# Patient Record
Sex: Male | Born: 1935 | Race: White | Hispanic: No | Marital: Married | Smoking: Former smoker
Health system: Southern US, Community
[De-identification: ages and names within clinical notes are randomized; demographics above are authoritative.]

## PROBLEM LIST (undated history)

## (undated) DIAGNOSIS — E785 Hyperlipidemia, unspecified: Secondary | ICD-10-CM

## (undated) DIAGNOSIS — E079 Disorder of thyroid, unspecified: Secondary | ICD-10-CM

## (undated) DIAGNOSIS — I10 Essential (primary) hypertension: Secondary | ICD-10-CM

## (undated) HISTORY — PX: PARTIAL HIP ARTHROPLASTY: SHX733

## (undated) HISTORY — DX: Essential (primary) hypertension: I10

## (undated) HISTORY — DX: Hyperlipidemia, unspecified: E78.5

## (undated) HISTORY — DX: Disorder of thyroid, unspecified: E07.9

## (undated) HISTORY — PX: OTHER SURGICAL HISTORY: SHX169

---

## 2005-08-26 ENCOUNTER — Ambulatory Visit: Payer: Self-pay | Admitting: Gastroenterology

## 2010-09-23 ENCOUNTER — Ambulatory Visit: Payer: Self-pay | Admitting: Internal Medicine

## 2011-07-23 ENCOUNTER — Ambulatory Visit: Payer: Self-pay | Admitting: Pain Medicine

## 2011-08-03 ENCOUNTER — Ambulatory Visit: Payer: Self-pay | Admitting: Pain Medicine

## 2011-09-10 ENCOUNTER — Ambulatory Visit: Payer: Self-pay | Admitting: Pain Medicine

## 2011-09-30 ENCOUNTER — Ambulatory Visit: Payer: Self-pay | Admitting: Pain Medicine

## 2011-10-29 ENCOUNTER — Ambulatory Visit: Payer: Self-pay | Admitting: Pain Medicine

## 2011-11-11 ENCOUNTER — Ambulatory Visit: Payer: Self-pay | Admitting: Pain Medicine

## 2011-12-08 ENCOUNTER — Ambulatory Visit: Payer: Self-pay | Admitting: Pain Medicine

## 2011-12-21 ENCOUNTER — Ambulatory Visit: Payer: Self-pay | Admitting: Pain Medicine

## 2012-01-12 ENCOUNTER — Ambulatory Visit: Payer: Self-pay | Admitting: Pain Medicine

## 2012-01-25 ENCOUNTER — Ambulatory Visit: Payer: Self-pay | Admitting: Pain Medicine

## 2012-03-01 ENCOUNTER — Ambulatory Visit: Payer: Self-pay | Admitting: Pain Medicine

## 2012-03-14 ENCOUNTER — Ambulatory Visit: Payer: Self-pay | Admitting: Pain Medicine

## 2012-04-11 ENCOUNTER — Ambulatory Visit: Payer: Self-pay | Admitting: Pain Medicine

## 2012-05-10 ENCOUNTER — Ambulatory Visit: Payer: Self-pay | Admitting: Pain Medicine

## 2012-05-23 ENCOUNTER — Ambulatory Visit: Payer: Self-pay | Admitting: Pain Medicine

## 2012-06-06 ENCOUNTER — Ambulatory Visit: Payer: Self-pay | Admitting: Pain Medicine

## 2012-07-05 ENCOUNTER — Ambulatory Visit: Payer: Self-pay | Admitting: Pain Medicine

## 2012-08-04 ENCOUNTER — Ambulatory Visit: Payer: Self-pay | Admitting: Pain Medicine

## 2012-09-06 ENCOUNTER — Ambulatory Visit: Payer: Self-pay | Admitting: Pain Medicine

## 2012-09-12 ENCOUNTER — Ambulatory Visit: Payer: Self-pay | Admitting: Pain Medicine

## 2012-10-11 ENCOUNTER — Ambulatory Visit: Payer: Self-pay | Admitting: Pain Medicine

## 2013-03-06 ENCOUNTER — Ambulatory Visit: Payer: Self-pay | Admitting: Pain Medicine

## 2013-09-06 ENCOUNTER — Ambulatory Visit: Payer: Self-pay | Admitting: Pain Medicine

## 2013-10-10 ENCOUNTER — Ambulatory Visit: Payer: Self-pay | Admitting: Pain Medicine

## 2015-08-27 ENCOUNTER — Encounter: Payer: Self-pay | Admitting: Pain Medicine

## 2015-08-27 ENCOUNTER — Ambulatory Visit: Payer: Medicare Other | Attending: Pain Medicine | Admitting: Pain Medicine

## 2015-08-27 VITALS — BP 166/57 | HR 50 | Temp 98.0°F | Resp 16 | Ht 68.0 in | Wt 185.0 lb

## 2015-08-27 DIAGNOSIS — E039 Hypothyroidism, unspecified: Secondary | ICD-10-CM | POA: Insufficient documentation

## 2015-08-27 DIAGNOSIS — M79606 Pain in leg, unspecified: Secondary | ICD-10-CM | POA: Diagnosis present

## 2015-08-27 DIAGNOSIS — M51369 Other intervertebral disc degeneration, lumbar region without mention of lumbar back pain or lower extremity pain: Secondary | ICD-10-CM | POA: Insufficient documentation

## 2015-08-27 DIAGNOSIS — M1990 Unspecified osteoarthritis, unspecified site: Secondary | ICD-10-CM | POA: Diagnosis not present

## 2015-08-27 DIAGNOSIS — E785 Hyperlipidemia, unspecified: Secondary | ICD-10-CM | POA: Diagnosis not present

## 2015-08-27 DIAGNOSIS — Z8546 Personal history of malignant neoplasm of prostate: Secondary | ICD-10-CM | POA: Diagnosis not present

## 2015-08-27 DIAGNOSIS — M47816 Spondylosis without myelopathy or radiculopathy, lumbar region: Secondary | ICD-10-CM | POA: Insufficient documentation

## 2015-08-27 DIAGNOSIS — M533 Sacrococcygeal disorders, not elsewhere classified: Secondary | ICD-10-CM | POA: Insufficient documentation

## 2015-08-27 DIAGNOSIS — G629 Polyneuropathy, unspecified: Secondary | ICD-10-CM | POA: Insufficient documentation

## 2015-08-27 DIAGNOSIS — I10 Essential (primary) hypertension: Secondary | ICD-10-CM | POA: Diagnosis not present

## 2015-08-27 DIAGNOSIS — M5136 Other intervertebral disc degeneration, lumbar region: Secondary | ICD-10-CM | POA: Diagnosis not present

## 2015-08-27 DIAGNOSIS — M545 Low back pain: Secondary | ICD-10-CM | POA: Diagnosis present

## 2015-08-27 NOTE — Progress Notes (Signed)
Subjective:    Patient ID: Theodore Graves, male    DOB: 08/29/1926, 82 y.o.   MRN: [id number]  HPI  The patient is an 82 year old gentleman who comes to pain management at the request of Dr. Einar CrowMarshall Anderson for further evaluation and treatment of pain involving the lower back and lower extremity region. The patient states that his lower back lower extremity pain has interfered with activities of daily living to significant degree. The patient denies any trauma change in events of daily living the call significant change in symptoms pathology. The patient is with known degenerative disc disease of the lumbar spine. The patient appeared to be with exacerbation of his pain due to degenerative disc disease of the lumbar spine. We discussed patient's condition and patient described her pain as aching and annoying pain that awakens patient from sleep. Patient states the pain is without known alleviating factors. We discussed patient's condition and will consider patient for lumbar facet, medial branch nerve, blocks to be performed at time of return appointment. We will consider patient for additional studies as well as consider patient for medication adjustments pending response to treatment and follow-up evaluation. All agreed to suggested treatment plan.             Review of Systems    Cardiovascular: High blood pressure  Pulmonary: Unremarkable  Neurological: Neuropathy of lower extremities  Psychological: Unremarkable  Gastrointestinal: Hyperlipidemia  Genitourinary: Carcinoma of prostate  Hematologic: Unremarkable  Endocrine: Hypothyroidism  Rheumatological: Osteoarthritis  Musculoskeletal: Unremarkable  Other significant: Unremarkable      Objective:   Physical Exam   There was tenderness of the splenius capitis and occipitalis region a mild degree with mild tenderness of the acromioclavicular and glenohumeral joint regions. Palpation of the cervical  facet cervical paraspinal musculature region was with mild tenderness to palpation. The patient appeared to be with unremarkable Spurling's maneuver. The patient appeared to be with bilaterally equal grip strength and Tinel and Phalen's maneuver were without increase of pain of significant degree. There was mild tenderness of the thoracic facet thoracic paraspinal musculature region. No crepitus of the thoracic region was noted. Palpation over the region of the PSIS and PII S region reproduced moderate discomfort. There was moderate increase of pain with pressure applied to the ileum with patient in lateral becomes position. Palpation of the gluteal and piriformis musculature regions reproduce moderate discomfort. There was mild tenderness of the greater trochanteric region iliotibial band region. Straight leg raise was tolerates approximately 30 without increase of pain with dorsiflexion noted. EHL strength appeared to be slightly decreased. DTRs were difficult to elicit appeared to be trace at the knees. There was negative clonus negative Homans and no sensory deficit or dermatomal dystrophy detected. The knees were attends to palpation in crepitus of the knees with negative anterior and posterior drawer signs without ballottement of the patella. There was negative clonus negative Homans. Abdomen was nontender and no costovertebral tenderness was noted.     Assessment & Plan:   Degenerative disc disease lumbar spine  Lumbar facet syndrome  Sacroiliac joint dysfunction  Hypothyroidism  Neuropathy of feet  History of cancer of prostate      PLAN  Continue present medications. Take vitamin B complex as discussed We may consider Neurontin, Cymbalta or other medications for treatment of pain of the feet  Lumbar facet, medial branch nerve blocks to be performed at time of return appointment  F/U PCP Dr. Dareen PianoAnderson for evaluation of  BP and general medical  condition  F/U surgical evaluation.  May consider pending follow-up evaluations  F/U neurological evaluation. May consider PNCV/EMG studies and other studies evaluate lower extremity paresthesias with concern regarding neuropathy due to treatment for carcinoma of prostate pending follow-up evaluations  May consider radiofrequency rhizolysis or intraspinal procedures pending response to present treatment and F/U evaluation   Patient to call [facility] should patient have concerns prior to scheduled return appointment

## 2015-08-27 NOTE — Patient Instructions (Addendum)
PLAN  Continue present medications. Take vitamin B complex as discussed We may consider Neurontin, Cymbalta or other medications for treatment of pain of the feet  Lumbar facet, medial branch nerve blocks to be performed at time of return appointment  F/U PCP Dr. Dareen PianoAnderson for evaluation of  BP and general medical  condition  F/U surgical evaluation. May consider pending follow-up evaluations  F/U neurological evaluation. May consider pending follow-up evaluations  May consider radiofrequency rhizolysis or intraspinal procedures pending response to present treatment and F/U evaluation   Patient to call [facility] Management Center should patient have concerns prior to scheduled return appointment. Facet Joint Block The facet joints connect the bones of the spine (vertebrae). They make it possible for you to bend, twist, and make other movements with your spine. They also prevent you from overbending, overtwisting, and making other excessive movements.  A facet joint block is a procedure where a numbing medicine (anesthetic) is injected into a facet joint. Often, a type of anti-inflammatory medicine called a steroid is also injected. A facet joint block may be done for two reasons:   Diagnosis. A facet joint block may be done as a test to see whether neck or back pain is caused by a worn-down or infected facet joint. If the pain gets better after a facet joint block, it means the pain is probably coming from the facet joint. If the pain does not get better, it means the pain is probably not coming from the facet joint.   Therapy. A facet joint block may be done to relieve neck or back pain caused by a facet joint. A facet joint block is only done as a therapy if the pain does not improve with medicine, exercise programs, physical therapy, and other forms of pain management. LET [facility]YOUR HEALTH CARE PROVIDER KNOW ABOUT:   Any allergies you have.   All medicines you are taking, including vitamins,  herbs, eyedrops, and over-the-counter medicines and creams.   Previous problems you or members of your family have had with the use of anesthetics.   Any blood disorders you have had.   Other health problems you have. RISKS AND COMPLICATIONS Generally, having a facet joint block is safe. However, as with any procedure, complications can occur. Possible complications associated with having a facet joint block include:   Bleeding.   Injury to a nerve near the injection site.   Pain at the injection site.   Weakness or numbness in areas controlled by nerves near the injection site.   Infection.   Temporary fluid retention.   Allergic reaction to anesthetics or medicines used during the procedure. BEFORE THE PROCEDURE   Follow your health care provider's instructions if you are taking dietary supplements or medicines. You may need to stop taking them or reduce your dosage.   Do not take any new dietary supplements or medicines without asking your health care provider first.   Follow your health care provider's instructions about eating and drinking before the procedure. You may need to stop eating and drinking several hours before the procedure.   Arrange to have an adult drive you home after the procedure. PROCEDURE  You may need to remove your clothing and dress in an open-back gown so that your health care provider can access your spine.   The procedure will be done while you are lying on an X-ray table. Most of the time you will be asked to lie on your stomach, but you may be asked to  lie in a different position if an injection will be made in your neck.   Special machines will be used to monitor your oxygen levels, heart rate, and blood pressure.   If an injection will be made in your neck, an intravenous (IV) tube will be inserted into one of your veins. Fluids and medicine will flow directly into your body through the IV tube.   The area over the facet  joint where the injection will be made will be cleaned with an antiseptic soap. The surrounding skin will be covered with sterile drapes.   An anesthetic will be applied to your skin to make the injection area numb. You may feel a temporary stinging or burning sensation.   A video X-ray machine will be used to locate the joint. A contrast dye may be injected into the facet joint area to help with locating the joint.   When the joint is located, an anesthetic medicine will be injected into the joint through the needle.   Your health care provider will ask you whether you feel pain relief. If you do feel relief, a steroid may be injected to provide pain relief for a longer period of time. If you do not feel relief or feel only partial relief, additional injections of an anesthetic may be made in other facet joints.   The needle will be removed, the skin will be cleansed, and bandages will be applied.  AFTER THE PROCEDURE   You will be observed for 15-30 minutes before being allowed to go home. Do not drive. Have an adult drive you or take a taxi or public transportation instead.   If you feel pain relief, the pain will return in several hours or days when the anesthetic wears off.   You may feel pain relief 2-14 days after the procedure. The amount of time this relief lasts varies from person to person.   It is normal to feel some tenderness over the injected area(s) for 2 days following the procedure.   If you have diabetes, you may have a temporary increase in blood sugar.   This information is not intended to replace advice given to you by your health care provider. Make sure you discuss any questions you have with your health care provider.   Document Released: 07/08/2006 Document Revised: 03/09/2014 Document Reviewed: 12/07/2011 Elsevier Interactive Patient Education 2016 [employer] GENERAL RISKS AND COMPLICATIONS  What are the risk, side effects and possible  complications? Generally speaking, most procedures are safe.  However, with any procedure there are risks, side effects, and the possibility of complications.  The risks and complications are dependent upon the sites that are lesioned, or the type of nerve block to be performed.  The closer the procedure is to the spine, the more serious the risks are.  Great care is taken when placing the radio frequency needles, block needles or lesioning probes, but sometimes complications can occur.  Infection: Any time there is an injection through the skin, there is a risk of infection.  This is why sterile conditions are used for these blocks.  There are four possible types of infection.  Localized skin infection.  Central Nervous System Infection-This can be in the form of Meningitis, which can be deadly.  Epidural Infections-This can be in the form of an epidural abscess, which can cause pressure inside of the spine, causing compression of the spinal cord with subsequent paralysis. This would require an emergency surgery to decompress, and there are no  guarantees that the patient would recover from the paralysis.  Discitis-This is an infection of the intervertebral discs.  It occurs in about 1% of discography procedures.  It is difficult to treat and it may lead to surgery.        2. Pain: the needles have to go through skin and soft tissues, will cause soreness.       3. Damage to internal structures:  The nerves to be lesioned may be near blood vessels or    other nerves which can be potentially damaged.       4. Bleeding: Bleeding is more common if the patient is taking blood thinners such as  aspirin, Coumadin, Ticiid, Plavix, etc., or if he/she have some genetic predisposition  such as hemophilia. Bleeding into the spinal canal can cause compression of the spinal  cord with subsequent paralysis.  This would require an emergency surgery to  decompress and there are no guarantees that the patient would  recover from the  paralysis.       5. Pneumothorax:  Puncturing of a lung is a possibility, every time a needle is introduced in  the area of the chest or upper back.  Pneumothorax refers to free air around the  collapsed lung(s), inside of the thoracic cavity (chest cavity).  Another two possible  complications related to a similar event would include: Hemothorax and Chylothorax.   These are variations of the Pneumothorax, where instead of air around the collapsed  lung(s), you may have blood or chyle, respectively.       6. Spinal headaches: They may occur with any procedures in the area of the spine.       7. Persistent CSF (Cerebro-Spinal Fluid) leakage: This is a rare problem, but may occur  with prolonged intrathecal or epidural catheters either due to the formation of a fistulous  track or a dural tear.       8. Nerve damage: By working so close to the spinal cord, there is always a possibility of  nerve damage, which could be as serious as a permanent spinal cord injury with  paralysis.       9. Death:  Although rare, severe deadly allergic reactions known as "Anaphylactic  reaction" can occur to any of the medications used.      10. Worsening of the symptoms:  We can always make thing worse.  What are the chances of something like this happening? Chances of any of this occuring are extremely low.  By statistics, you have more of a chance of getting killed in a motor vehicle accident: while driving to the hospital than any of the above occurring .  Nevertheless, you should be aware that they are possibilities.  In general, it is similar to taking a shower.  Everybody knows that you can slip, hit your head and get killed.  Does that mean that you should not shower again?  Nevertheless always keep in mind that statistics do not mean anything if you happen to be on the wrong side of them.  Even if a procedure has a 1 (one) in a 1,000,000 (million) chance of going wrong, it you happen to be that  one..Also, keep in mind that by statistics, you have more of a chance of having something go wrong when taking medications.  Who should not have this procedure? If you are on a blood thinning medication (e.g. Coumadin, Plavix, see list of "Blood Thinners"), or if you have an active infection going  on, you should not have the procedure.  If you are taking any blood thinners, please inform your physician.  How should I prepare for this procedure?  Do not eat or drink anything at least six hours prior to the procedure.  Bring a driver with you .  It cannot be a taxi.  Come accompanied by an adult that can drive you back, and that is strong enough to help you if your legs get weak or numb from the local anesthetic.  Take all of your medicines the morning of the procedure with just enough water to swallow them.  If you have diabetes, make sure that you are scheduled to have your procedure done first thing in the morning, whenever possible.  If you have diabetes, take only half of your insulin dose and notify our nurse that you have done so as soon as you arrive at the clinic.  If you are diabetic, but only take blood sugar pills (oral hypoglycemic), then do not take them on the morning of your procedure.  You may take them after you have had the procedure.  Do not take aspirin or any aspirin-containing medications, at least eleven (11) days prior to the procedure.  They may prolong bleeding.  Wear loose fitting clothing that may be easy to take off and that you would not mind if it got stained with Betadine or blood.  Do not wear any jewelry or perfume  Remove any nail coloring.  It will interfere with some of our monitoring equipment.  NOTE: Remember that this is not meant to be interpreted as a complete list of all possible complications.  Unforeseen problems may occur.  BLOOD THINNERS The following drugs contain aspirin or other products, which can cause increased bleeding during  surgery and should not be taken for 2 weeks prior to and 1 week after surgery.  If you should need take something for relief of minor pain, you may take acetaminophen which is found in Tylenol,m Datril, Anacin-3 and Panadol. It is not blood thinner. The products listed below are.  Do not take any of the products listed below in addition to any listed on your instruction sheet.  A.P.C or A.P.C with Codeine Codeine Phosphate Capsules #3 Ibuprofen Ridaura  ABC compound Congesprin Imuran rimadil  Advil Cope Indocin Robaxisal  Alka-Seltzer Effervescent Pain Reliever and Antacid Coricidin or Coricidin-D  Indomethacin Rufen  Alka-Seltzer plus Cold Medicine Cosprin Ketoprofen S-A-C Tablets  Anacin Analgesic Tablets or Capsules Coumadin Korlgesic Salflex  Anacin Extra Strength Analgesic tablets or capsules CP-2 Tablets Lanoril Salicylate  Anaprox Cuprimine Capsules Levenox Salocol  Anexsia-D Dalteparin Magan Salsalate  Anodynos Darvon compound Magnesium Salicylate Sine-off  Ansaid Dasin Capsules Magsal Sodium Salicylate  Anturane Depen Capsules Marnal Soma  APF Arthritis pain formula Dewitt's Pills Measurin Stanback  Argesic Dia-Gesic Meclofenamic Sulfinpyrazone  Arthritis Bayer Timed Release Aspirin Diclofenac Meclomen Sulindac  Arthritis pain formula Anacin Dicumarol Medipren Supac  Analgesic (Safety coated) Arthralgen Diffunasal Mefanamic Suprofen  Arthritis Strength Bufferin Dihydrocodeine Mepro Compound Suprol  Arthropan liquid Dopirydamole Methcarbomol with Aspirin Synalgos  ASA tablets/Enseals Disalcid Micrainin Tagament  Ascriptin Doan's Midol Talwin  Ascriptin A/D Dolene Mobidin Tanderil  Ascriptin Extra Strength Dolobid Moblgesic Ticlid  Ascriptin with Codeine Doloprin or Doloprin with Codeine Momentum Tolectin  Asperbuf Duoprin Mono-gesic Trendar  Aspergum Duradyne Motrin or Motrin IB Triminicin  Aspirin plain, buffered or enteric coated Durasal Myochrisine Trigesic  Aspirin  Suppositories Easprin Nalfon Trillsate  Aspirin with Codeine Ecotrin Regular or Extra Strength  Naprosyn Uracel  Atromid-S Efficin Naproxen Ursinus  Auranofin Capsules Elmiron Neocylate Vanquish  Axotal Emagrin Norgesic Verin  Azathioprine Empirin or Empirin with Codeine Normiflo Vitamin E  Azolid Emprazil Nuprin Voltaren  Bayer Aspirin plain, buffered or children's or timed BC Tablets or powders Encaprin Orgaran Warfarin Sodium  Buff-a-Comp Enoxaparin Orudis Zorpin  Buff-a-Comp with Codeine Equegesic Os-Cal-Gesic   Buffaprin Excedrin plain, buffered or Extra Strength Oxalid   Bufferin Arthritis Strength Feldene Oxphenbutazone   Bufferin plain or Extra Strength Feldene Capsules Oxycodone with Aspirin   Bufferin with Codeine Fenoprofen Fenoprofen Pabalate or Pabalate-SF   Buffets II Flogesic Panagesic   Buffinol plain or Extra Strength Florinal or Florinal with Codeine Panwarfarin   Buf-Tabs Flurbiprofen Penicillamine   Butalbital Compound Four-way cold tablets Penicillin   Butazolidin Fragmin Pepto-Bismol   Carbenicillin Geminisyn Percodan   Carna Arthritis Reliever Geopen Persantine   Carprofen Gold's salt Persistin   Chloramphenicol Goody's Phenylbutazone   Chloromycetin Haltrain Piroxlcam   Clmetidine heparin Plaquenil   Cllnoril Hyco-pap Ponstel   Clofibrate Hydroxy chloroquine Propoxyphen         Before stopping any of these medications, be sure to consult the physician who ordered them.  Some, such as Coumadin (Warfarin) are ordered to prevent or treat serious conditions such as "deep thrombosis", "pumonary embolisms", and other heart problems.  The amount of time that you may need off of the medication may also vary with the medication and the reason for which you were taking it.  If you are taking any of these medications, please make sure you notify your pain physician before you undergo any procedures.

## 2015-08-27 NOTE — Progress Notes (Signed)
Safety precautions to be maintained throughout the outpatient stay will include: orient to surroundings, keep bed in low position, maintain call bell within reach at all times, provide assistance with transfer out of bed and ambulation.  

## 2015-09-05 LAB — TOXASSURE SELECT 13 (MW), URINE: PDF: 0

## 2015-09-05 NOTE — Progress Notes (Signed)
Quick Note:  Reviewed. ______ 

## 2015-09-18 ENCOUNTER — Ambulatory Visit: Payer: Medicare Other | Attending: Pain Medicine | Admitting: Pain Medicine

## 2015-09-18 ENCOUNTER — Encounter: Payer: Self-pay | Admitting: Pain Medicine

## 2015-09-18 VITALS — BP 142/78 | HR 52 | Temp 97.7°F | Resp 16 | Ht 67.0 in | Wt 180.0 lb

## 2015-09-18 DIAGNOSIS — M5136 Other intervertebral disc degeneration, lumbar region: Secondary | ICD-10-CM | POA: Insufficient documentation

## 2015-09-18 DIAGNOSIS — M545 Low back pain: Secondary | ICD-10-CM | POA: Diagnosis present

## 2015-09-18 DIAGNOSIS — M47816 Spondylosis without myelopathy or radiculopathy, lumbar region: Secondary | ICD-10-CM

## 2015-09-18 DIAGNOSIS — M533 Sacrococcygeal disorders, not elsewhere classified: Secondary | ICD-10-CM

## 2015-09-18 DIAGNOSIS — M79606 Pain in leg, unspecified: Secondary | ICD-10-CM | POA: Diagnosis present

## 2015-09-18 MED ORDER — CEFAZOLIN IN D5W 1 GM/50ML IV SOLN
1.0000 g | Freq: Once | INTRAVENOUS | Status: DC
Start: 2015-09-18 — End: 2020-05-24

## 2015-09-18 MED ORDER — ORPHENADRINE CITRATE 30 MG/ML IJ SOLN
60.0000 mg | Freq: Once | INTRAMUSCULAR | Status: DC
  Filled 2015-09-18: qty 2

## 2015-09-18 MED ORDER — MIDAZOLAM HCL 5 MG/5ML IJ SOLN
5.0000 mg | Freq: Once | INTRAMUSCULAR | Status: DC
  Filled 2015-09-18: qty 5

## 2015-09-18 MED ORDER — CEFAZOLIN SODIUM 1 G IJ SOLR
INTRAMUSCULAR | Status: AC
  Administered 2015-09-18: 1 g
  Filled 2015-09-18: qty 10

## 2015-09-18 MED ORDER — BUPIVACAINE HCL (PF) 0.25 % IJ SOLN
30.0000 mL | Freq: Once | INTRAMUSCULAR | Status: AC
  Administered 2015-09-18: 30 mL
  Filled 2015-09-18: qty 30

## 2015-09-18 MED ORDER — FENTANYL CITRATE (PF) 100 MCG/2ML IJ SOLN
100.0000 ug | Freq: Once | INTRAMUSCULAR | Status: DC
  Filled 2015-09-18: qty 2

## 2015-09-18 MED ORDER — CEFUROXIME AXETIL 250 MG PO TABS: 250.0000 mg | ORAL_TABLET | Freq: Two times a day (BID) | ORAL | Status: DC

## 2015-09-18 MED ORDER — LACTATED RINGERS IV SOLN: 1000.0000 mL | INTRAVENOUS | Status: DC

## 2015-09-18 MED ORDER — TRIAMCINOLONE ACETONIDE 40 MG/ML IJ SUSP
40.0000 mg | Freq: Once | INTRAMUSCULAR | Status: AC
Start: 2015-09-18 — End: 2015-09-18
  Administered 2015-09-18: 40 mg
  Filled 2015-09-18: qty 1

## 2015-09-18 NOTE — Progress Notes (Signed)
Safety precautions to be maintained throughout the outpatient stay will include: orient to surroundings, keep bed in low position, maintain call bell within reach at all times, provide assistance with transfer out of bed and ambulation.  

## 2015-09-18 NOTE — Progress Notes (Signed)
Subjective:    Patient ID: Theodore Graves, male    DOB: [birth year], 82 y.o.   MRN: [id number]  HPI  PROCEDURE PERFORMED: Lumbar facet (medial branch block)   NOTE: The patient is a 82 y.o. male who [facility] for further evaluation and treatment of pain involving the lumbar and lower extremity region. Prior studies revealed the patient to be with evidence of multilevel degenerative disc disease of the lumbar spine. There is concern regarding significant component of patient's pain being due to lumbar facet syndrome. The risks, benefits, and expectations of the procedure have been discussed and explained to the patient who was understanding and in agreement with suggested treatment plan. We will proceed with interventional treatment as discussed and as explained to the patient who was understanding and wished to proceed with procedure as planned.   DESCRIPTION OF PROCEDURE: Lumbar facet (medial branch block) with  EKG, blood pressure, pulse, capnography, and pulse oximetry monitoring. The procedure was performed with the patient in the prone position. Betadine prep of proposed entry site performed.   NEEDLE PLACEMENT AT: Left L 2 lumbar facet (medial branch block). Under fluoroscopic guidance with oblique orientation of 15 degrees, a 22-gauge needle was inserted at the L 2 vertebral body level with needle placed at the targeted area of Burton's Eye or Eye of the Scotty Dog with documentation of needle placement in the superior and lateral border of targeted area of Burton's Eye or Eye of the Scotty Dog with oblique orientation of 15 degrees. Following documentation of needle placement at the L 2 vertebral body level, needle placement was then accomplished at the L 3 vertebral body level.   NEEDLE PLACEMENT AT L3, L4, and L5 VERTEBRAL BODY LEVELS ON THE LEFT SIDE The procedure was performed at the L3, L4, and L5 vertebral body levels exactly as was performed at the L 2 vertebral  body level utilizing the same technique and under fluoroscopic guidance.  NEEDLE PLACEMENT AT THE SACRAL ALA with AP view of the lumbosacral spine. With the patient in the prone position, Betadine prep of proposed entry site accomplished, a 22 gauge needle was inserted in the region of the sacral ala (groove formed by the superior articulating process of S1 and the sacral wing). Following documentation of needle placement at the sacral ala,   Needle placement was then verified at all levels on lateral view. Following documentation of needle placement at all levels on lateral view and following negative aspiration for heme and CSF, each level was injected with 1 mL of 0.25% bupivacaine with Kenalog.     LUMBAR FACET, MEDIAL BRANCH NERVE, BLOCKS PERFORMED ON THE RIGHT SIDE   The procedure was performed on the right side exactly as was performed on the left side at the same levels and utilizing the same technique under fluoroscopic guidance.     The patient tolerated the procedure well. A total of 40 mg of Kenalog was utilized for the procedure.   PLAN:  1. Medications: The patient will continue presently prescribed medications. 2. May consider modification of treatment regimen at time of return appointment pending response to treatment rendered on today's visit. 3. The patient is to follow-up with primary care physician  Dr. Dareen Piano for further evaluation of blood pressure and general medical condition status post steroid injection performed on today's visit. 4. Surgical follow-up evaluation.Marland Kitchen Has been addressed. Patient prefers to avoid surgical evaluation at this time 5. Neurological follow-up evaluation.. May consider [facility] EMG studies  and other studies 6. The patient may be candidate for radiofrequency procedures, implantation type procedures, and other treatment pending response to treatment and follow-up evaluation. 7. The patient has been advised to call the [facility]  prior to scheduled return appointment should there be significant change in condition or should patient have other concerns regarding condition prior to scheduled return appointment.  The patient is understanding and in agreement with suggested treatment plan.   Review of Systems     Objective:   Physical Exam        Assessment & Plan:

## 2015-09-18 NOTE — Patient Instructions (Addendum)
PLAN  Continue present medications. Take vitamin B complex as discussed We may consider Neurontin, Cymbalta or other medications for treatment of pain of the feet Please obtain Ceftin antibiotic today and begin taking Ceftin antibiotic today as prescribed  F/U PCP Dr. Dareen PianoAnderson for evaluation of  BP and general medical  condition  F/U surgical evaluation. May consider pending follow-up evaluations  F/U neurological evaluation. May consider pending follow-up evaluations  May consider radiofrequency rhizolysis or intraspinal procedures pending response to present treatment and F/U evaluation   Patient to call [facility] should patient have concerns prior to scheduled return appointment.Pain Management Discharge Instructions  General Discharge Instructions :  If you need to reach your doctor call: Monday-Friday 8:00 am - 4:00 pm at [phone] or toll free [phone].  After clinic hours [phone] to have operator reach doctor.  Bring all of your medication bottles to all your appointments in the pain clinic.  To cancel or reschedule your appointment with Pain Management please remember to call 24 hours in advance to avoid a fee.  Refer to the educational materials which you have been given on: General Risks, I had my Procedure. Discharge Instructions, Post Sedation.  Post Procedure Instructions:  The drugs you were given will stay in your system until tomorrow, so for the next 24 hours you should not drive, make any legal decisions or drink any alcoholic beverages.  You may eat anything you prefer, but it is better to start with liquids then soups and crackers, and gradually work up to solid foods.  Please notify your doctor immediately if you have any unusual bleeding, trouble breathing or pain that is not related to your normal pain.  Depending on the type of procedure that was done, some parts of your body may feel week and/or numb.  This usually clears up by  tonight or the next day.  Walk with the use of an assistive device or accompanied by an adult for the 24 hours.  You may use ice on the affected area for the first 24 hours.  Put ice in a Ziploc bag and cover with a towel and place against area 15 minutes on 15 minutes off.  You may switch to heat after 24 hours.

## 2015-09-19 ENCOUNTER — Telehealth: Payer: Self-pay | Admitting: *Deleted

## 2015-09-19 NOTE — Telephone Encounter (Signed)
Voicemail left with patient to call our office if there are questions or concerns re; procedure on yesterday.  

## 2015-10-15 ENCOUNTER — Encounter: Payer: Self-pay | Admitting: Pain Medicine

## 2015-10-15 ENCOUNTER — Ambulatory Visit: Payer: Medicare Other | Attending: Pain Medicine | Admitting: Pain Medicine

## 2015-10-15 VITALS — BP 124/60 | HR 59 | Temp 97.9°F | Resp 16 | Ht 67.0 in | Wt 180.0 lb

## 2015-10-15 DIAGNOSIS — M5136 Other intervertebral disc degeneration, lumbar region: Secondary | ICD-10-CM | POA: Insufficient documentation

## 2015-10-15 DIAGNOSIS — Z8546 Personal history of malignant neoplasm of prostate: Secondary | ICD-10-CM | POA: Insufficient documentation

## 2015-10-15 DIAGNOSIS — M79606 Pain in leg, unspecified: Secondary | ICD-10-CM | POA: Diagnosis present

## 2015-10-15 DIAGNOSIS — M545 Low back pain: Secondary | ICD-10-CM | POA: Diagnosis present

## 2015-10-15 DIAGNOSIS — E039 Hypothyroidism, unspecified: Secondary | ICD-10-CM | POA: Insufficient documentation

## 2015-10-15 DIAGNOSIS — G629 Polyneuropathy, unspecified: Secondary | ICD-10-CM | POA: Diagnosis not present

## 2015-10-15 DIAGNOSIS — M533 Sacrococcygeal disorders, not elsewhere classified: Secondary | ICD-10-CM | POA: Diagnosis not present

## 2015-10-15 DIAGNOSIS — M47816 Spondylosis without myelopathy or radiculopathy, lumbar region: Secondary | ICD-10-CM

## 2015-10-15 NOTE — Progress Notes (Signed)
Safety precautions to be maintained throughout the outpatient stay will include: orient to surroundings, keep bed in low position, maintain call bell within reach at all times, provide assistance with transfer out of bed and ambulation.  

## 2015-10-15 NOTE — Progress Notes (Signed)
     The patient is an 82 year old gentleman who returns to pain management for further evaluation and treatment of pain involving the lumbar lower extremity region. The patient is status post lumbar facet, medial branch nerve, blocks performed at time of previous visit to pain management. The patient returned today and stated that he is pain-free. The patient is able to perform all activities of daily living as well as obtaining restful sleep without dispense any pain. The patient is without need for medications for treatment of any pain is well. We informed patient that we will remain available to consider additional interventional treatment of modification of treatment regimen should they be return of any significant pain. The patient was understanding and agreed to call if he had any return of significant pain. The patient was extremely pleased with the success of the procedure performed treatment of his pain. We remain available to consider additional medication of treatment as needed. All agreed to suggested treatment plan.    Physical examination There was there was mild tinnitus to palpation of paraspinal muscular treat the cervical region cervical facet region with mild tenderness of the splenius capitis and occipitalis region. Palpation of the acromioclavicular and glenohumeral joint region was attends to palpation of minimal degree and patient appeared to be with minimal difficulty performing drop test. The patient was with bilaterally equal grip strength without increased pain with Tinel and Phalen's maneuver. Palpation over the lumbar paraspinal musculature and lumbar facet region was without increased pain with lateral bending rotation extension and palpation over the lumbar facet region. There was no significant tends to palpation of the greater trochanteric region iliotibial band region. Palpation over the PSIS and PII S region was without increased pain of significant degree. Straight leg  raising tolerate 30 without increased pain with dorsiflexion noted. DTRs were difficult to elicit appeared to be trace at the knees. No sensory deficit or dermatomal dystrophy detected. Negative clonus negative Homans.. The abdomen was nontender and no costovertebral tenderness was noted.     Assessment  Degenerative disc disease lumbar spine  Lumbar facet syndrome  Sacroiliac joint dysfunction  Hypothyroidism  Neuropathy of feet  History of cancer of prostate     PLAN  Continue present medications. Take vitamin B complex as discussed We may consider Neurontin, Cymbalta or other medications for treatment of pain of the feet  F/U PCP Dr. Dareen PianoAnderson for evaluation of  BP and general medical  condition  F/U surgical evaluation. May consider pending follow-up evaluations  F/U neurological evaluation. May consider pending follow-up evaluations  May consider radiofrequency rhizolysis or intraspinal procedures pending response to present treatment and F/U evaluation   Patient to call [facility] should patient have concerns prior to scheduled return appointment.

## 2015-10-15 NOTE — Patient Instructions (Signed)
PLAN  Continue present medications. Take vitamin B complex as discussed We may consider Neurontin, Cymbalta or other medications for treatment of pain of the feet  F/U PCP Dr. Dareen PianoAnderson for evaluation of  BP and general medical  condition  F/U surgical evaluation. May consider pending follow-up evaluations  F/U neurological evaluation. May consider pending follow-up evaluations  May consider radiofrequency rhizolysis or intraspinal procedures pending response to present treatment and F/U evaluation   Patient to call [facility] should patient have concerns prior to scheduled return appointment.

## 2017-10-21 ENCOUNTER — Other Ambulatory Visit: Payer: Self-pay | Admitting: Physical Medicine and Rehabilitation

## 2017-10-21 DIAGNOSIS — M5416 Radiculopathy, lumbar region: Secondary | ICD-10-CM

## 2017-11-04 ENCOUNTER — Ambulatory Visit
Admission: RE | Admit: 2017-11-04 | Discharge: 2017-11-04 | Disposition: A | Discharge status: Home / Self Care | Payer: Medicare Other | Source: Ambulatory Visit | Attending: Physical Medicine and Rehabilitation | Admitting: Physical Medicine and Rehabilitation

## 2017-11-04 DIAGNOSIS — M5416 Radiculopathy, lumbar region: Secondary | ICD-10-CM | POA: Diagnosis present

## 2017-11-04 DIAGNOSIS — M4697 Unspecified inflammatory spondylopathy, lumbosacral region: Secondary | ICD-10-CM | POA: Diagnosis not present

## 2017-11-04 DIAGNOSIS — M5116 Intervertebral disc disorders with radiculopathy, lumbar region: Secondary | ICD-10-CM | POA: Diagnosis not present

## 2017-11-04 DIAGNOSIS — M7138 Other bursal cyst, other site: Secondary | ICD-10-CM | POA: Insufficient documentation

## 2019-08-30 IMAGING — MR MR LUMBAR SPINE W/O CM
5 series · 31 of 48 positions shown · non-contrast
Comparison: MRI dated 09/23/2010

CLINICAL DATA: Low back pain and right hip and leg pain. Lumbar
radiculitis.

EXAM:
MRI LUMBAR SPINE WITHOUT CONTRAST
TECHNIQUE: Multiplanar, multisequence MR imaging of the lumbar spine was
performed. No intravenous contrast was administered.

[Series 9: T2 · sagittal · 4.0mm · 0.81mm/px · 6 of 17 slices shown (1 of 2)]
[im 1/17]
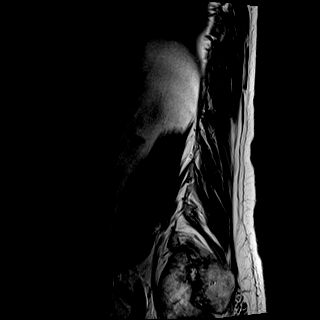
[im 4/17]
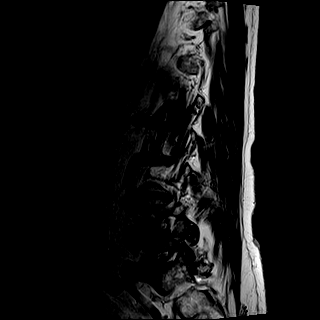
[im 7/17]
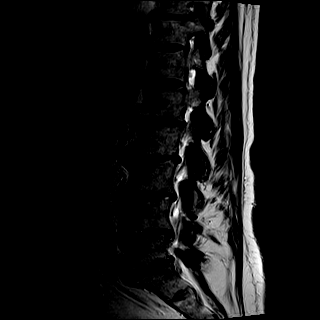
[im 10/17]
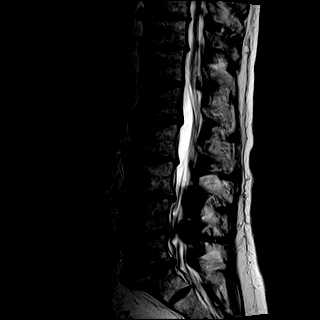
[im 13/17]
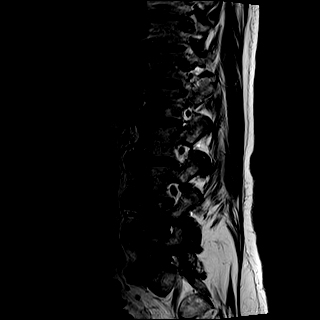
[im 17/17]
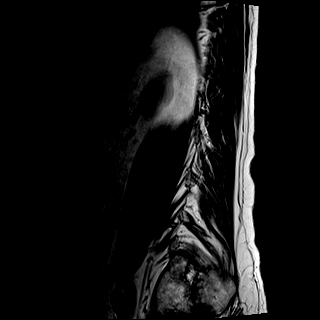

[Series 10: T1 · sagittal · 4.0mm · 0.81mm/px · 6 of 17 slices shown (1 of 2)]
[im 1/17]
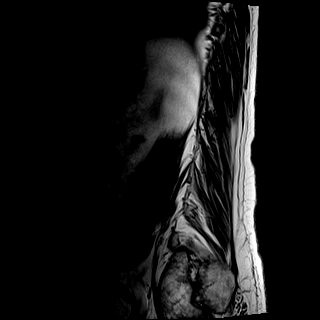
[im 4/17]
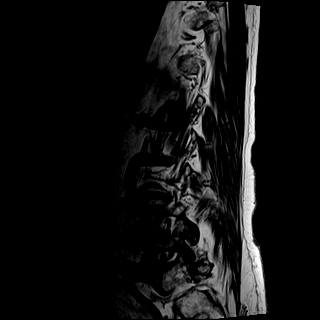
[im 7/17]
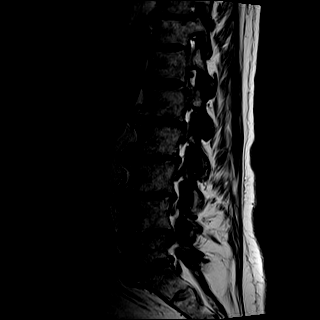
[im 10/17]
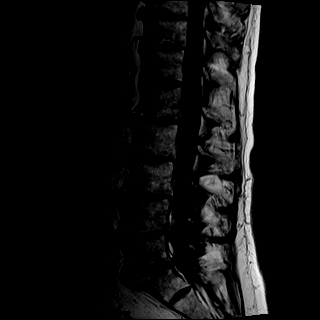
[im 13/17]
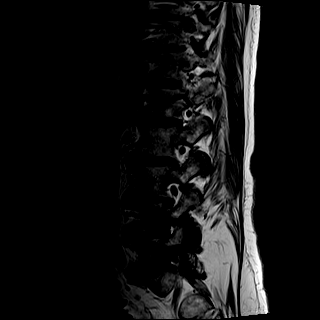
[im 17/17]
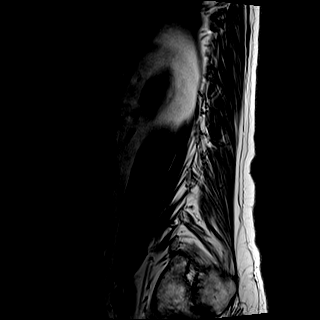

[Series 11: STIR · sagittal · 4.0mm · 0.41mm/px · 1 of 17 slices shown]
[im 1/17]
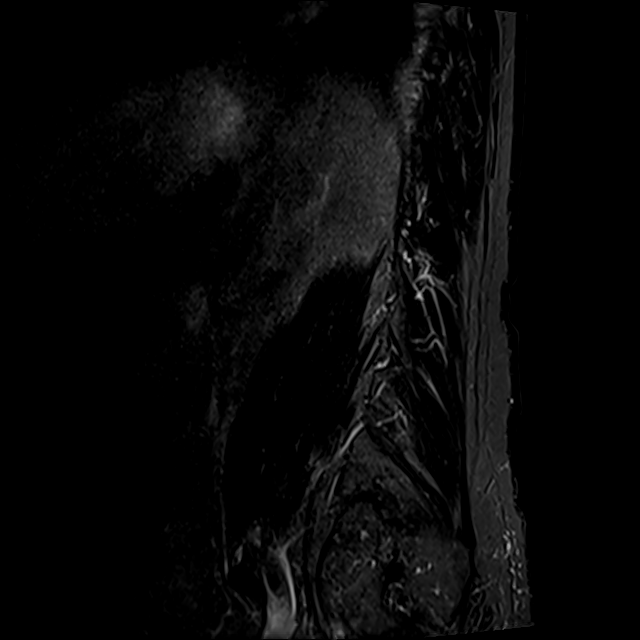

[Series 12: T2 · axial · 4.0mm · 0.78mm/px · z∈[-195,+53]mm · 9 of 44 slices shown (2 of 2)]
[im 1/44]
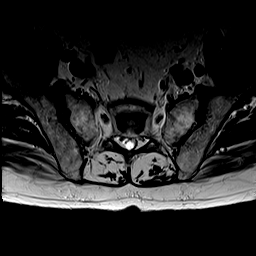
[im 7/44]
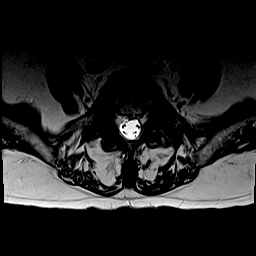
[im 13/44]
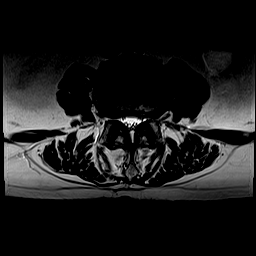
[im 19/44]
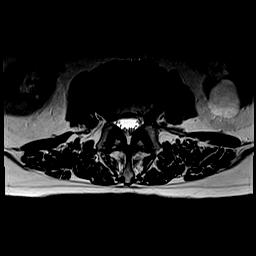
[im 22/44]
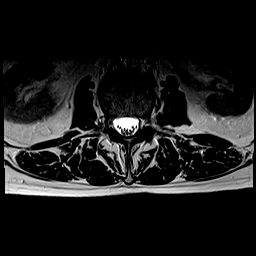
[im 25/44]
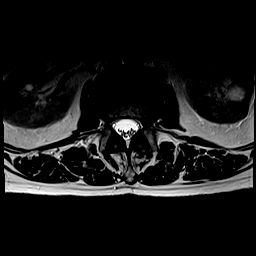
[im 31/44]
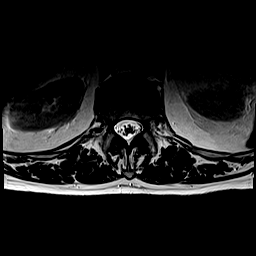
[im 37/44]
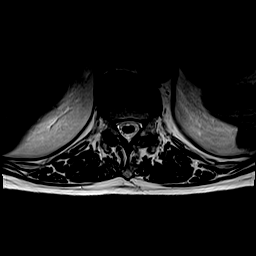
[im 44/44]
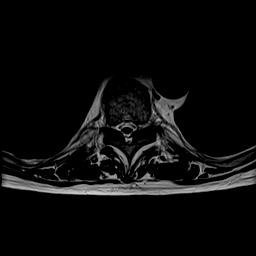

[Series 13: T1 · axial · 4.0mm · 0.39mm/px · z∈[-195,+53]mm · 9 of 44 slices shown (2 of 2)]
[im 1/44]
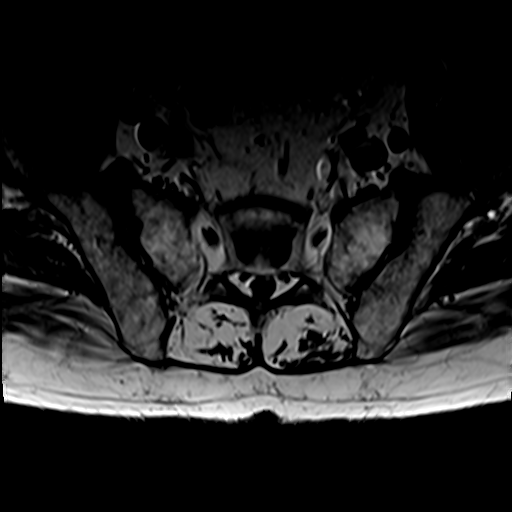
[im 7/44]
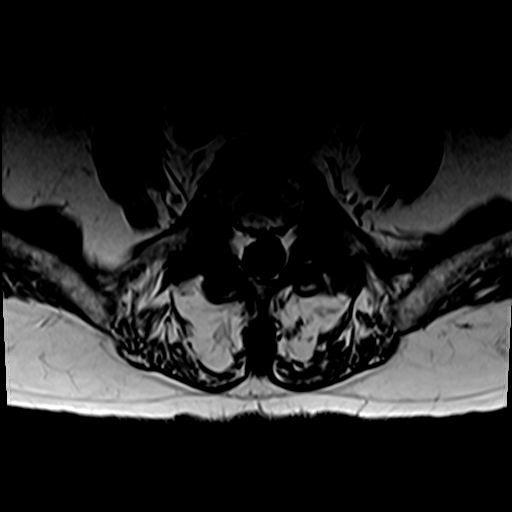
[im 13/44]
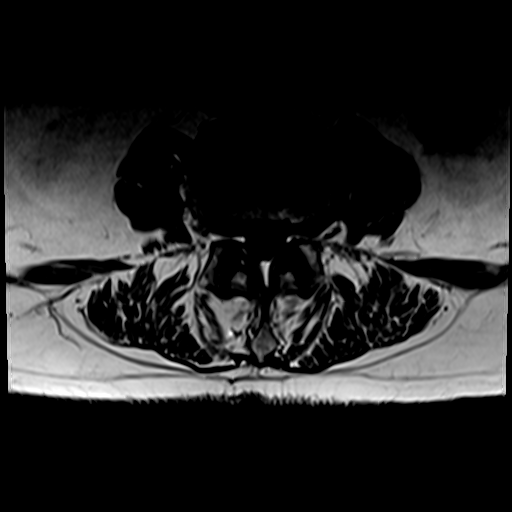
[im 19/44]
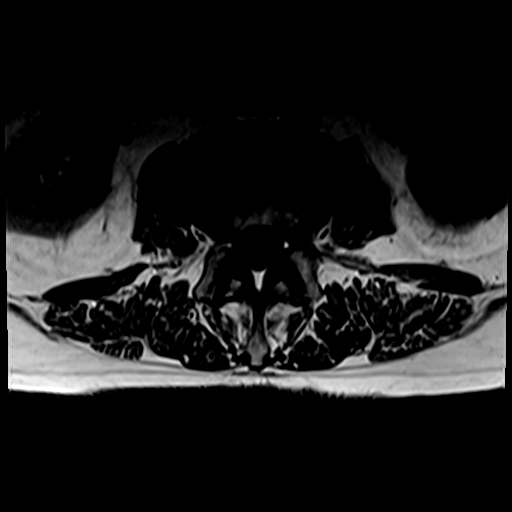
[im 22/44]
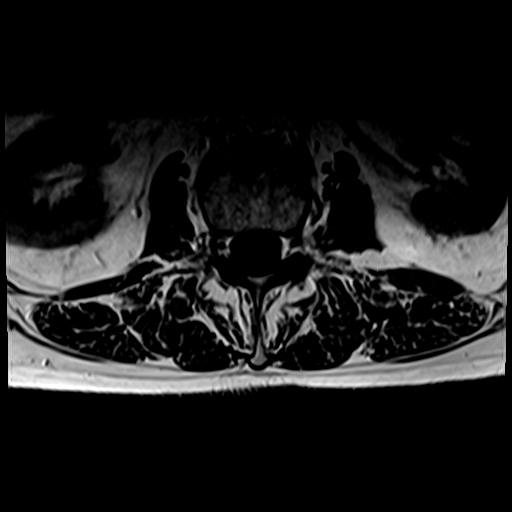
[im 25/44]
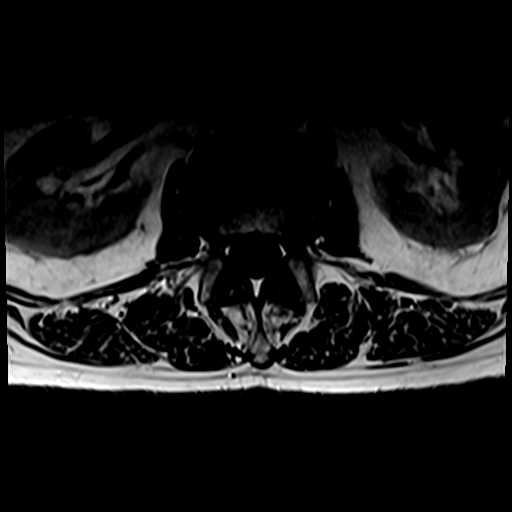
[im 31/44]
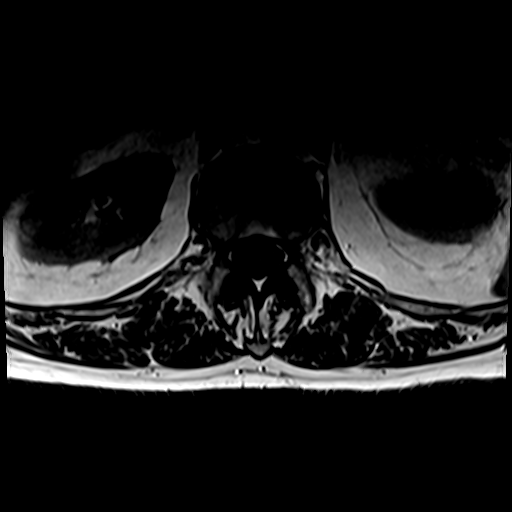
[im 37/44]
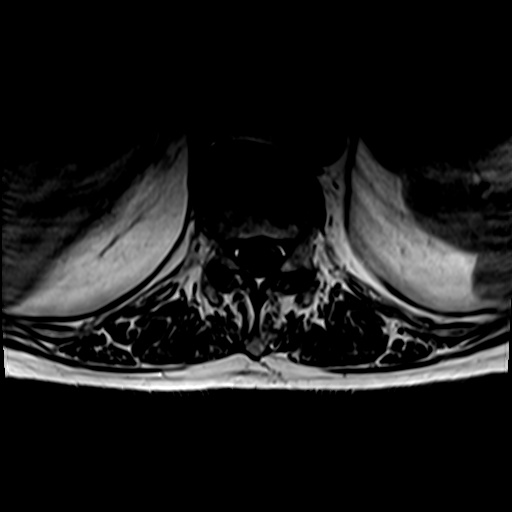
[im 44/44]
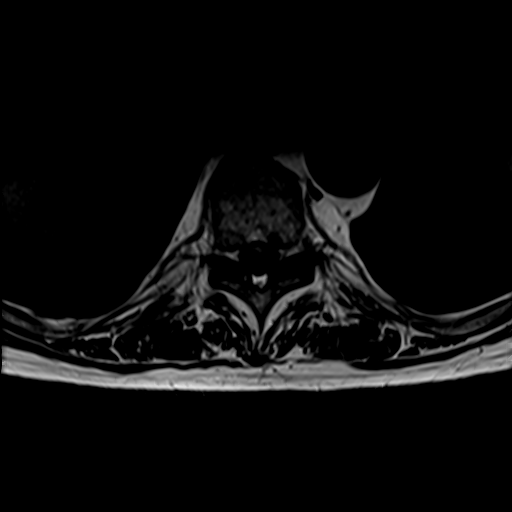

[31 of 48 positions shown; findings below may reference images not displayed]

FINDINGS: Segmentation:  Standard.

Alignment:  Minimal retrolisthesis at L1-2 through L4-5.

Vertebrae:  No fracture, evidence of discitis, or bone lesion.

Conus medullaris and cauda equina: Conus extends to the L1 level.
Conus and cauda equina appear normal.

Paraspinal and other soft tissues: 3 benign-appearing cysts in the
left kidney. Diverticulosis in the sigmoid portion of the colon.
Otherwise negative.

Disc levels:

T12-L1: Normal disc. Slight hypertrophy of the ligamentum flavum and
facet joints, minimally increased, without neural impingement.

L1-2: Chronic disc space narrowing. Tiny broad-based disc bulge with
accompanying osteophytes without neural impingement, minimally
progressed.

L2-3: Chronic disc space narrowing. Tiny broad-based disc bulge with
accompanying osteophytes without neural impingement, minimally
progressed. Minimal hypertrophy of the ligamentum flavum without
neural impingement.

L3-4: Progressive disc space narrowing with slight retrolisthesis
with a tiny broad-based disc bulge with accompanying osteophytes
minimally compressing the thecal sac without focal neural
impingement. No foraminal stenosis. Slight hypertrophy of the
ligamentum flavum and facet joints, minimally progressed.

L4-5: Chronic disc space narrowing with minimal retrolisthesis. Tiny
broad-based disc bulge with accompanying osteophytes, minimally
progressed since the prior study. There appears to be a new small
focal disc protrusion in the right neural foramen which could affect
the right L4 nerve. Slight bilateral facet arthritis and ligamentum
flavum hypertrophy to, unchanged.

L5-S1: Progressive chronic degenerative disc disease with disc space
narrowing with a broad-based tiny disc protrusion with accompanying
osteophytes without focal neural impingement. Progressive bilateral
foraminal stenosis which could affect either or both L5 nerves.
Severe bilateral facet arthritis, slightly progressed. There is a
focal synovial cyst in the right neural foramen which could affect
the adjacent L5 nerve. This is best seen on image 6 of series 11 and
image 39 of series 12.
IMPRESSION: 1. New small disc protrusion into the right neural foramen at L4-5
which appears to have a mass effect upon the exiting L4 nerve.
2. Progressive bilateral foraminal stenosis at L5-S1 with a new
synovial cyst in the right neural foramen.
3. Progressive severe bilateral facet arthritis at L5-S1.

## 2020-05-20 ENCOUNTER — Encounter: Payer: Self-pay | Admitting: Medical Oncology

## 2020-05-20 ENCOUNTER — Emergency Department: Payer: Medicare Other

## 2020-05-20 ENCOUNTER — Inpatient Hospital Stay
Admission: EM | Admit: 2020-05-20 | Discharge: 2020-05-24 | DRG: 536 | Disposition: A | Discharge status: Skilled Nursing Facility | Payer: Medicare Other | Attending: Internal Medicine | Admitting: Internal Medicine

## 2020-05-20 ENCOUNTER — Other Ambulatory Visit: Payer: Self-pay

## 2020-05-20 DIAGNOSIS — Z87891 Personal history of nicotine dependence: Secondary | ICD-10-CM

## 2020-05-20 DIAGNOSIS — I455 Other specified heart block: Secondary | ICD-10-CM | POA: Diagnosis not present

## 2020-05-20 DIAGNOSIS — S7292XA Unspecified fracture of left femur, initial encounter for closed fracture: Secondary | ICD-10-CM

## 2020-05-20 DIAGNOSIS — E559 Vitamin D deficiency, unspecified: Secondary | ICD-10-CM | POA: Diagnosis present

## 2020-05-20 DIAGNOSIS — E039 Hypothyroidism, unspecified: Secondary | ICD-10-CM | POA: Diagnosis present

## 2020-05-20 DIAGNOSIS — Z79899 Other long term (current) drug therapy: Secondary | ICD-10-CM | POA: Diagnosis not present

## 2020-05-20 DIAGNOSIS — I440 Atrioventricular block, first degree: Secondary | ICD-10-CM | POA: Diagnosis present

## 2020-05-20 DIAGNOSIS — M9702XA Periprosthetic fracture around internal prosthetic left hip joint, initial encounter: Secondary | ICD-10-CM | POA: Diagnosis present

## 2020-05-20 DIAGNOSIS — T40425A Adverse effect of tramadol, initial encounter: Secondary | ICD-10-CM | POA: Diagnosis not present

## 2020-05-20 DIAGNOSIS — S72009A Fracture of unspecified part of neck of unspecified femur, initial encounter for closed fracture: Secondary | ICD-10-CM | POA: Diagnosis present

## 2020-05-20 DIAGNOSIS — E785 Hyperlipidemia, unspecified: Secondary | ICD-10-CM | POA: Diagnosis present

## 2020-05-20 DIAGNOSIS — S7292XS Unspecified fracture of left femur, sequela: Secondary | ICD-10-CM | POA: Diagnosis not present

## 2020-05-20 DIAGNOSIS — Z8249 Family history of ischemic heart disease and other diseases of the circulatory system: Secondary | ICD-10-CM | POA: Diagnosis not present

## 2020-05-20 DIAGNOSIS — M5136 Other intervertebral disc degeneration, lumbar region: Secondary | ICD-10-CM | POA: Diagnosis present

## 2020-05-20 DIAGNOSIS — Z20822 Contact with and (suspected) exposure to covid-19: Secondary | ICD-10-CM | POA: Diagnosis present

## 2020-05-20 DIAGNOSIS — W010XXA Fall on same level from slipping, tripping and stumbling without subsequent striking against object, initial encounter: Secondary | ICD-10-CM | POA: Diagnosis present

## 2020-05-20 DIAGNOSIS — I10 Essential (primary) hypertension: Secondary | ICD-10-CM | POA: Diagnosis present

## 2020-05-20 DIAGNOSIS — Z79890 Hormone replacement therapy (postmenopausal): Secondary | ICD-10-CM | POA: Diagnosis not present

## 2020-05-20 DIAGNOSIS — Z66 Do not resuscitate: Secondary | ICD-10-CM | POA: Diagnosis present

## 2020-05-20 DIAGNOSIS — Y92018 Other place in single-family (private) house as the place of occurrence of the external cause: Secondary | ICD-10-CM | POA: Diagnosis not present

## 2020-05-20 DIAGNOSIS — R531 Weakness: Secondary | ICD-10-CM | POA: Diagnosis not present

## 2020-05-20 DIAGNOSIS — Z885 Allergy status to narcotic agent status: Secondary | ICD-10-CM

## 2020-05-20 DIAGNOSIS — Z23 Encounter for immunization: Secondary | ICD-10-CM

## 2020-05-20 DIAGNOSIS — Z888 Allergy status to other drugs, medicaments and biological substances status: Secondary | ICD-10-CM | POA: Diagnosis not present

## 2020-05-20 DIAGNOSIS — S72142A Displaced intertrochanteric fracture of left femur, initial encounter for closed fracture: Secondary | ICD-10-CM | POA: Diagnosis not present

## 2020-05-20 DIAGNOSIS — R110 Nausea: Secondary | ICD-10-CM

## 2020-05-20 DIAGNOSIS — Z8546 Personal history of malignant neoplasm of prostate: Secondary | ICD-10-CM

## 2020-05-20 DIAGNOSIS — R001 Bradycardia, unspecified: Secondary | ICD-10-CM | POA: Diagnosis not present

## 2020-05-20 DIAGNOSIS — S7292XD Unspecified fracture of left femur, subsequent encounter for closed fracture with routine healing: Secondary | ICD-10-CM | POA: Diagnosis not present

## 2020-05-20 LAB — CBC WITH DIFFERENTIAL/PLATELET
Abs Immature Granulocytes: 0.04 10*3/uL (ref 0.00–0.07)
Basophils Absolute: 0 10*3/uL (ref 0.0–0.1)
Basophils Relative: 1 %
Eosinophils Absolute: 0.3 10*3/uL (ref 0.0–0.5)
Eosinophils Relative: 4 %
HCT: 39.6 % (ref 39.0–52.0)
Hemoglobin: 13.4 g/dL (ref 13.0–17.0)
Immature Granulocytes: 1 %
Lymphocytes Relative: 17 %
Lymphs Abs: 1.5 10*3/uL (ref 0.7–4.0)
MCH: 29.8 pg (ref 26.0–34.0)
MCHC: 33.8 g/dL (ref 30.0–36.0)
MCV: 88 fL (ref 80.0–100.0)
Monocytes Absolute: 0.9 10*3/uL (ref 0.1–1.0)
Monocytes Relative: 10 %
Neutro Abs: 5.9 10*3/uL (ref 1.7–7.7)
Neutrophils Relative %: 67 %
Platelets: 307 10*3/uL (ref 150–400)
RBC: 4.5 MIL/uL (ref 4.22–5.81)
RDW: 13.2 % (ref 11.5–15.5)
WBC: 8.6 10*3/uL (ref 4.0–10.5)
nRBC: 0 % (ref 0.0–0.2)

## 2020-05-20 LAB — COMPREHENSIVE METABOLIC PANEL
ALT: 17 U/L (ref 0–44)
AST: 30 U/L (ref 15–41)
Albumin: 4.3 g/dL (ref 3.5–5.0)
Alkaline Phosphatase: 58 U/L (ref 38–126)
Anion gap: 9 (ref 5–15)
BUN: 28 mg/dL — ABNORMAL HIGH (ref 8–23)
CO2: 24 mmol/L (ref 22–32)
Calcium: 9.1 mg/dL (ref 8.9–10.3)
Chloride: 102 mmol/L (ref 98–111)
Creatinine, Ser: 0.95 mg/dL (ref 0.61–1.24)
GFR, Estimated: >60 mL/min (ref >60)
Glucose, Bld: 82 mg/dL (ref 70–99)
Potassium: 4.5 mmol/L (ref 3.5–5.1)
Sodium: 135 mmol/L (ref 135–145)
Total Bilirubin: 0.8 mg/dL (ref 0.3–1.2)
Total Protein: 7.2 g/dL (ref 6.5–8.1)

## 2020-05-20 LAB — APTT: aPTT: 32 seconds (ref 24–36)

## 2020-05-20 LAB — RESP PANEL BY RT-PCR (FLU A&B, COVID) ARPGX2
Influenza A by PCR: NEGATIVE
Influenza B by PCR: NEGATIVE
SARS Coronavirus 2 by RT PCR: NEGATIVE

## 2020-05-20 LAB — PROTIME-INR
INR: 1.1 (ref 0.8–1.2)
Prothrombin Time: 13.3 seconds (ref 11.4–15.2)

## 2020-05-20 MED ORDER — ATORVASTATIN CALCIUM 20 MG PO TABS
20.0000 mg | ORAL_TABLET | Freq: Every day | ORAL | Status: DC
  Administered 2020-05-22 – 2020-05-24 (×3): 20 mg via ORAL
  Filled 2020-05-20 (×3): qty 1

## 2020-05-20 MED ORDER — TETANUS-DIPHTH-ACELL PERTUSSIS 5-2.5-18.5 LF-MCG/0.5 IM SUSY
0.5000 mL | PREFILLED_SYRINGE | Freq: Once | INTRAMUSCULAR | Status: AC
  Administered 2020-05-20: 0.5 mL via INTRAMUSCULAR
  Filled 2020-05-20: qty 0.5

## 2020-05-20 MED ORDER — SENNA 8.6 MG PO TABS
1.0000 | ORAL_TABLET | Freq: Two times a day (BID) | ORAL | Status: DC
  Administered 2020-05-21 – 2020-05-24 (×5): 8.6 mg via ORAL
  Filled 2020-05-20 (×8): qty 1

## 2020-05-20 MED ORDER — DOCUSATE SODIUM 100 MG PO CAPS
100.0000 mg | ORAL_CAPSULE | Freq: Two times a day (BID) | ORAL | Status: DC
  Administered 2020-05-21 – 2020-05-24 (×5): 100 mg via ORAL
  Filled 2020-05-20 (×7): qty 1

## 2020-05-20 MED ORDER — OXYCODONE HCL 5 MG PO TABS
5.0000 mg | ORAL_TABLET | Freq: Once | ORAL | Status: AC
Start: 2020-05-20 — End: 2020-05-20
  Administered 2020-05-20: 5 mg via ORAL
  Filled 2020-05-20: qty 1

## 2020-05-20 MED ORDER — METHOCARBAMOL 500 MG PO TABS
500.0000 mg | ORAL_TABLET | Freq: Four times a day (QID) | ORAL | Status: DC | PRN
  Administered 2020-05-21 – 2020-05-22 (×2): 500 mg via ORAL
  Filled 2020-05-20 (×2): qty 1

## 2020-05-20 MED ORDER — ENOXAPARIN SODIUM 40 MG/0.4ML ~~LOC~~ SOLN: 40.0000 mg | SUBCUTANEOUS | Status: DC

## 2020-05-20 MED ORDER — FENTANYL CITRATE (PF) 100 MCG/2ML IJ SOLN
50.0000 ug | Freq: Once | INTRAMUSCULAR | Status: AC
  Administered 2020-05-20: 50 ug via INTRAVENOUS
  Filled 2020-05-20: qty 2

## 2020-05-20 MED ORDER — LEVOTHYROXINE SODIUM 50 MCG PO TABS
50.0000 ug | ORAL_TABLET | Freq: Every day | ORAL | Status: DC
  Administered 2020-05-22 – 2020-05-24 (×3): 50 ug via ORAL
  Filled 2020-05-20 (×5): qty 1

## 2020-05-20 MED ORDER — HYDROCODONE-ACETAMINOPHEN 5-325 MG PO TABS
1.0000 | ORAL_TABLET | Freq: Four times a day (QID) | ORAL | Status: DC | PRN
  Administered 2020-05-21 (×2): 2 via ORAL
  Filled 2020-05-20 (×2): qty 2

## 2020-05-20 MED ORDER — POLYETHYLENE GLYCOL 3350 17 G PO PACK: 17.0000 g | PACK | Freq: Every day | ORAL | Status: DC | PRN

## 2020-05-20 MED ORDER — METHOCARBAMOL 1000 MG/10ML IJ SOLN
500.0000 mg | Freq: Four times a day (QID) | INTRAVENOUS | Status: DC | PRN
  Filled 2020-05-20: qty 5

## 2020-05-20 MED ORDER — SODIUM CHLORIDE 0.9 % IV SOLN: INTRAVENOUS | Status: DC

## 2020-05-20 MED ORDER — FENTANYL CITRATE (PF) 100 MCG/2ML IJ SOLN: 50.0000 ug | INTRAMUSCULAR | Status: DC | PRN

## 2020-05-20 MED ORDER — ONDANSETRON HCL 4 MG/2ML IJ SOLN
4.0000 mg | Freq: Once | INTRAMUSCULAR | Status: AC
  Administered 2020-05-20: 4 mg via INTRAVENOUS
  Filled 2020-05-20: qty 2

## 2020-05-20 NOTE — H&P (Signed)
Theodore Graves UEA:[id number] DOB: 01/25/1927 DOA: 05/20/2020     PCP: Lauro RegulusAnderson, Marshall W, MD   Outpatient Specialists:  NONE    Patient arrived to ER on 05/20/20 at 1710 Referred by Attending Concha SeFunke, Mary E, MD   Patient coming from: home Lives With family    Chief Complaint:   Chief Complaint  Patient presents with  . Hip Pain    HPI: Theodore Graves is a 87 y.o. male with medical history significant of hypertension, hyperlipidemia  , hypothyroidism    Presented with   Fall that occurred while pressure washing his patio he slipped and fall.   no head injury no LOC. No CP no SOB He takes a walk every day no tobacco no EtOH Infectious risk factors:  Reports none    Has   been vaccinated against COVID  and boosted   Initial COVID TEST  NEGATIVE   Lab Results  Component Value Date   SARSCOV2NAA NEGATIVE 05/20/2020     Regarding pertinent Chronic problems:     Hyperlipidemia -  on statins Lipitor    Hypothyroidism: on synthroid     While in ER: Found to have periprosthetic left hip fracture     ED Triage Vitals  Enc Vitals Group     BP 05/20/20 1740 (!) 166/68     Pulse Rate 05/20/20 1725 65     Resp 05/20/20 1725 17     Temp 05/20/20 1725 98.1 F (36.7 C)     Temp Source 05/20/20 1725 Oral     SpO2 05/20/20 1725 99 %     Weight 05/20/20 1723 178 lb 9.2 oz (81 kg)     Height 05/20/20 1723 5\' 7"  (1.702 m)     Head Circumference --      Peak Flow --      Pain Score 05/20/20 1723 8     Pain Loc --      Pain Edu? --      Excl. in GC? --   TMAX(24)@     _________________________________________ Significant initial  Findings: Abnormal Labs Reviewed  COMPREHENSIVE METABOLIC PANEL - Abnormal; Notable for the following components:      Result Value   BUN 28 (*)    All other components within normal limits   ____________________________________________ Ordered   CXR - NON acute HIp fracture    ECG: Ordered Personally reviewed by me showing: HR  : 66 Rhythm: SR, prolonged PR  no evidence of ischemic changes QTC 443 __________   The recent clinical data is shown below. Vitals:   05/20/20 1740 05/20/20 1800 05/20/20 1830 05/20/20 1945  BP: (!) 166/68 (!) 138/56 (!) 144/64   Pulse:  (!) 56 (!) 54 (!) 54  Resp:  19 19 (!) 23  Temp:      TempSrc:      SpO2:  100% 100% 100%  Weight:      Height:          WBC     Component Value Date/Time   WBC 8.6 05/20/2020 1731   LYMPHSABS 1.5 05/20/2020 1731   MONOABS 0.9 05/20/2020 1731   EOSABS 0.3 05/20/2020 1731   BASOSABS 0.0 05/20/2020 1731    UA not ordered     Results for orders placed or performed during the hospital encounter of 05/20/20  Resp Panel by RT-PCR (Flu A&B, Covid) Nasopharyngeal Swab     Status: None   Collection Time: 05/20/20  5:31 PM   Specimen:  Nasopharyngeal Swab; Nasopharyngeal(NP) swabs in vial transport medium  Result Value Ref Range Status   SARS Coronavirus 2 by RT PCR NEGATIVE NEGATIVE Final         Influenza A by PCR NEGATIVE NEGATIVE Final   Influenza B by PCR NEGATIVE NEGATIVE Final          _______________________________________________________ ER Provider Called: Orthopedics   Dr.Hooten  They Recommend admit to medicine  Non operative pt/ot WBAT  ____________________ Hospitalist was called for admission for non-operative hip fracture  The following Work up has been ordered so far:  Orders Placed This Encounter  Procedures  . Resp Panel by RT-PCR (Flu A&B, Covid) Nasopharyngeal Swab  . DG Hip Unilat W or Wo Pelvis 2-3 Views Left  . DG Chest Portable 1 View  . CBC with Differential  . Comprehensive metabolic panel  . Protime-INR  . APTT  . Consult to hospitalist  . Airborne and Contact precautions  . EKG 12-Lead     Following Medications were ordered in ER: Medications  oxyCODONE (Oxy IR/ROXICODONE) immediate release tablet 5 mg (has no administration in time range)  fentaNYL (SUBLIMAZE) injection 50 mcg (50 mcg Intravenous  Given 05/20/20 1747)  ondansetron (ZOFRAN) injection 4 mg (4 mg Intravenous Given 05/20/20 1746)  Tdap (BOOSTRIX) injection 0.5 mL (0.5 mLs Intramuscular Given 05/20/20 1747)        Consult Orders  (From admission, onward)         Start     Ordered   05/20/20 2006  Consult to hospitalist  Once       Provider:  (Not yet assigned)  Question Answer Comment  Place call to: [phone]   Reason for Consult Admit      05/20/20 2005            OTHER Significant initial  Findings:  labs showing:    Recent Labs  Lab 05/20/20 1731  NA 135  K 4.5  CO2 24  GLUCOSE 82  BUN 28*  CREATININE 0.95  CALCIUM 9.1    Cr   Stable  Lab Results  Component Value Date   CREATININE 0.95 05/20/2020    Recent Labs  Lab 05/20/20 1731  AST 30  ALT 17  ALKPHOS 58  BILITOT 0.8  PROT 7.2  ALBUMIN 4.3   Lab Results  Component Value Date   CALCIUM 9.1 05/20/2020          Plt: Lab Results  Component Value Date   PLT 307 05/20/2020        Recent Labs  Lab 05/20/20 1731  WBC 8.6  NEUTROABS 5.9  HGB 13.4  HCT 39.6  MCV 88.0  PLT 307    HG/HCT   stable,       Component Value Date/Time   HGB 13.4 05/20/2020 1731   HCT 39.6 05/20/2020 1731   MCV 88.0 05/20/2020 1731       Cultures: No results found for: SDES, SPECREQUEST, CULT, REPTSTATUS   Radiological Exams on Admission: DG Chest Portable 1 View  Result Date: 05/20/2020 CLINICAL DATA:  Hip fracture, preoperative assessment, tripped and fell while pressure washing a house EXAM: PORTABLE CHEST 1 VIEW COMPARISON:  Portable exam 1855 hours without priors for comparison FINDINGS: Normal heart size, mediastinal contours, and pulmonary vascularity. Atherosclerotic calcification aorta. Minimal peribronchial thickening. No infiltrate, pleural effusion, or pneumothorax. Bones demineralized with suspect BILATERAL chronic rotator cuff tears. IMPRESSION: No acute abnormalities. Electronically Signed   By: Ulyses Southward M.D.   On:  05/20/2020 19:27   DG Hip Unilat W or Wo Pelvis 2-3 Views Left  Result Date: 05/20/2020 CLINICAL DATA:  Hip fracture EXAM: DG HIP (WITH OR WITHOUT PELVIS) 2-3V LEFT COMPARISON:  09/13/2018 FINDINGS: Osseous demineralization. Multilevel degenerative disc and facet disease changes of visualized lower lumbar spine. BILATERAL hip prostheses. Displaced fracture of the proximal LEFT femur involving the greater trochanter and extending into the intertrochanteric region. A large area of lucency is seen surrounding the proximal portion of the femoral component, with asymmetric location of the femoral head of the prosthesis within the acetabular cup suggesting liner wear, question particle disease accounting for lucency. Proximal LEFT femoral diaphysis and lesser trochanter appear intact. No dislocation identified. Visualized pelvis appears intact. Surgical clips in pelvis bilaterally. IMPRESSION: Osseous demineralization with BILATERAL hip prostheses. Fracture identified at proximal LEFT femur involving the intertrochanteric region and greater trochanter, minimally displaced. Underlying lucency at the proximal LEFT femur adjacent to the prosthesis may potentially represent sequela of particle disease as above. Electronically Signed   By: Ulyses Southward M.D.   On: 05/20/2020 19:31   _______________________________________________________________________________________________________ Latest  Blood pressure (!) 144/64, pulse (!) 54, temperature 98.1 F (36.7 C), temperature source Oral, resp. rate (!) 23, height 5\' 7"  (1.702 m), weight 81 kg, SpO2 100 %.   Review of Systems:    Pertinent positives include: fall  Constitutional:  No weight loss, night sweats, Fevers, chills, fatigue, weight loss  HEENT:  No headaches, Difficulty swallowing,Tooth/dental problems,Sore throat,  No sneezing, itching, ear ache, nasal congestion, post nasal drip,  Cardio-vascular:  No chest pain, Orthopnea, PND, anasarca, dizziness,  palpitations.no Bilateral lower extremity swelling  GI:  No heartburn, indigestion, abdominal pain, nausea, vomiting, diarrhea, change in bowel habits, loss of appetite, melena, blood in stool, hematemesis Resp:  no shortness of breath at rest. No dyspnea on exertion, No excess mucus, no productive cough, No non-productive cough, No coughing up of blood.No change in color of mucus.No wheezing. Skin:  no rash or lesions. No jaundice GU:  no dysuria, change in color of urine, no urgency or frequency. No straining to urinate.  No flank pain.  Musculoskeletal:  No joint pain or no joint swelling. No decreased range of motion. No back pain.  Psych:  No change in mood or affect. No depression or anxiety. No memory loss.  Neuro: no localizing neurological complaints, no tingling, no weakness, no double vision, no gait abnormality, no slurred speech, no confusion  All systems reviewed and apart from HOPI all are negative _______________________________________________________________________________________________ Past Medical History:   Past Medical History:  Diagnosis Date  . Hyperlipidemia   . Hypertension   . Thyroid condition       Past Surgical History:  Procedure Laterality Date  . PARTIAL HIP ARTHROPLASTY    . prostate cancer      Social History:  Ambulatory  independently       reports that he has quit smoking. He has never used smokeless tobacco. He reports that he does not drink alcohol and does not use drugs.  Family History:   Family History  Problem Relation Age of Onset  . Heart disease Mother    ______________________________________________________________________________________________ Allergies: Allergies  Allergen Reactions  . Morphine And Related Other (See Comments)    Headache     Prior to Admission medications   Medication Sig Start Date End Date Taking? Authorizing Provider  atorvastatin (LIPITOR) 20 MG tablet Take 20 mg by mouth daily.     [provider]  cefUROXime (  CEFTIN) 250 MG tablet Take 1 tablet (250 mg total) by mouth 2 (two) times daily with a meal. Patient not taking: Reported on 10/15/2015 09/18/15   Ewing Schlein, MD  levothyroxine (SYNTHROID, LEVOTHROID) 50 MCG tablet Take 50 mcg by mouth daily before breakfast.    [provider]    ___________________________________________________________________________________________________ Physical Exam: Vitals with BMI 05/20/2020 05/20/2020 05/20/2020  Height - - -  Weight - - -  BMI - - -  Systolic - 144 138  Diastolic - 64 56  Pulse 54 54 56     1. General:  in No Acute distress   Chronically ill -appearing 2. Psychological: Alert and Oriented 3. Head/ENT: Dry Mucous Membranes                          Head Non traumatic, neck supple                            Poor Dentition 4. SKIN: normal Skin turgor,  Skin clean Dry and intact no rash 5. Heart: Regular rate and rhythm no  Murmur, no Rub or gallop 6. Lungs:  no wheezes or crackles   7. Abdomen: Soft,  non-tender, Non distended  bowel sounds present 8. Lower extremities: no clubbing, cyanosis, no  edema 9. Neurologically Grossly intact, moving all 4 extremities equally   10. MSK: Normal range of motion limited in left hip    Chart has been reviewed  ______________________________________________________________________________________________  Assessment/Plan  87 y.o. male with medical history significant of hypertension, hyperlipidemia  , hypothyroidism  Admitted for non operative left hip fracture Present on Admission: . Hip fracture (HCC) -as per orthopedics nonoperative PT OT assessment, pain management with IV pain medications Weightbearing as able  . Hypothyroidism (acquired) - - Check TSH continue home medications at current dose  . Hyperlipidemia continue Lipitor   Other plan as per orders.  DVT prophylaxis:   Lovenox       Code Status:    Code Status: Not on file    DNR/DNI   as per patient   I had personally discussed CODE STATUS with patient     Family Communication:   Family   at  Bedside  plan of care was discussed   With Wife,   Disposition Plan:  likely will need placement for rehabilitation                             Following barriers for discharge:                                                         Pain controlled with PO medications                                                        Will likely need home health,                            Will need consultants to evaluate patient prior  to discharge                       Would benefit from PT/OT eval prior to DC  Ordered                   Swallow eval - SLP ordered                                       Consults called: orthopedics are aware    Admission status:  ED Disposition    ED Disposition Condition Comment   Admit  Hospital Area: [facility] [[id number]]  Level of Care: Med-Surg [[id number]]  Covid Evaluation: Confirmed COVID Negative  Diagnosis: Hip fracture [facility]) [[id number]]  Admitting Physician: Therisa Doyne [[id number]]  Attending Physician: Therisa Doyne [[id number]]  Estimated length of stay: past midnight tomorrow  Certification:: I certify this patient will need inpatient services for at least 2 midnights          inpatient     I Expect 2 midnight stay secondary to severity of patient's current illness need for inpatient interventions justified by the following:      Severe lab/radiological/exam abnormalities including:    left hip frax   That are currently affecting medical management.   I expect  patient to be hospitalized for 2 midnights requiring inpatient medical care.  Patient is at high risk for adverse outcome (such as loss of life or disability) if not treated.  Indication for inpatient stay as follows:    severe pain requiring acute inpatient management,     Need for , IV fluids  IV pain medications,      Level of care     tele  For 12H    Lab Results  Component Value Date   SARSCOV2NAA NEGATIVE 05/20/2020     Precautions: admitted as Covid Negative autions     PPE: Used by the provider:   N95  eye Goggles,  Gloves      Anastassia Doutova 05/20/2020, 11:09 PM    [facility]     after 2 AM please page floor coverage PA If 7AM-7PM, please contact the day team taking care of the patient using [email or web address]   Patient was evaluated in the context of the global COVID-19 pandemic, which necessitated consideration that the patient might be at risk for infection with the SARS-CoV-2 virus that causes COVID-19. Institutional protocols and algorithms that pertain to the evaluation of patients at risk for COVID-19 are in a state of rapid change based on information released by regulatory bodies including the CDC and federal and state organizations. These policies and algorithms were followed during the patient's care.

## 2020-05-20 NOTE — ED Provider Notes (Signed)
[facility] Emergency Department Provider Note  ____________________________________________   Event Date/Time   First MD Initiated Contact with Patient 05/20/20 1716     (approximate)  I have reviewed the triage vital signs and the nursing notes.   HISTORY  Chief Complaint Hip Pain    HPI Theodore Graves is a 87 y.o. male with hypertension, hyperlipidemia who comes in for mechanical fall.  Patient was power washing when he had a mechanical fall onto his left hip.  Patient reports pain in the hip that is severe, constant, worse with movement, better at rest.  He reports that he did not hit his head he did not lose consciousness he denies any other injuries.  States that he fell directly onto his left hip.  Patient states that he has had a hip replacement on that side.  Patient denies any LOC.  He denies any chest pain, shortness of breath, abdominal pain          Past Medical History:  Diagnosis Date  . Hyperlipidemia   . Hypertension   . Thyroid condition     Patient Active Problem List   Diagnosis Date Noted  . DDD (degenerative disc disease), lumbar 08/27/2015  . Facet syndrome, lumbar 08/27/2015  . Sacroiliac joint dysfunction 08/27/2015    Past Surgical History:  Procedure Laterality Date  . PARTIAL HIP ARTHROPLASTY    . prostate cancer      Prior to Admission medications   Medication Sig Start Date End Date Taking? Authorizing Provider  atorvastatin (LIPITOR) 20 MG tablet Take 20 mg by mouth daily.    [provider]  cefUROXime (CEFTIN) 250 MG tablet Take 1 tablet (250 mg total) by mouth 2 (two) times daily with a meal. Patient not taking: Reported on 10/15/2015 09/18/15   Ewing Schlein, MD  levothyroxine (SYNTHROID, LEVOTHROID) 50 MCG tablet Take 50 mcg by mouth daily before breakfast.    [provider]    Allergies Morphine and related  Family History  Problem Relation Age of Onset  . Heart disease Mother      Social History Social History   Tobacco Use  . Smoking status: Former Games developer  . Smokeless tobacco: Never Used  Substance Use Topics  . Alcohol use: No    Alcohol/week: 0.0 standard drinks  . Drug use: No      Review of Systems Constitutional: No fever/chills Eyes: No visual changes. ENT: No sore throat. Cardiovascular: Denies chest pain. Respiratory: Denies shortness of breath. Gastrointestinal: No abdominal pain.  No nausea, no vomiting.  No diarrhea.  No constipation. Genitourinary: Negative for dysuria. Musculoskeletal: Negative for back pain.  Left hip pain Skin: Negative for rash. Neurological: Negative for headaches, focal weakness or numbness. All other ROS negative ____________________________________________   PHYSICAL EXAM:  VITAL SIGNS: Blood pressure (!) 144/64, pulse (!) 54, temperature 98.1 F (36.7 C), temperature source Oral, resp. rate (!) 23, height 5\' 7"  (1.702 m), weight 81 kg, SpO2 100 %.  Constitutional: Alert and oriented x3. Well appearing and in no acute distress. Eyes: Conjunctivae are normal. EOMI. Head: Atraumatic. Nose: No congestion/rhinnorhea. Mouth/Throat: Mucous membranes are moist.   Neck: No stridor. Trachea Midline. FROM Cardiovascular: Normal rate, regular rhythm. Grossly normal heart sounds.  Good peripheral circulation. Respiratory: Normal respiratory effort.  No retractions. Lungs CTAB. Gastrointestinal: Soft and nontender. No distention. No abdominal bruits.  Musculoskeletal: Pain in the left hip.  Pain with movement of the hip.  No other pain on the leg.  Old bruising noted on the foot.  Patient does have a distal pulse confirmed with Doppler.  He does have some abrasions on his forearms but denies any other focal tenderness is able to move everything else. Neurologic:  Normal speech and language. No gross focal neurologic deficits are appreciated.  Skin:  Skin is warm, dry and intact. No rash noted. Psychiatric: Mood and  affect are normal. Speech and behavior are normal. GU: Deferred   ____________________________________________   LABS (all labs ordered are listed, but only abnormal results are displayed)  Labs Reviewed  COMPREHENSIVE METABOLIC PANEL - Abnormal; Notable for the following components:      Result Value   BUN 28 (*)    All other components within normal limits  RESP PANEL BY RT-PCR (FLU A&B, COVID) ARPGX2  CBC WITH DIFFERENTIAL/PLATELET  PROTIME-INR  APTT   ____________________________________________   ED ECG REPORT I, Concha Se, the attending physician, personally viewed and interpreted this ECG.  Normal sinus rate of 66, no ST elevation, no T wave inversions, type I AV block ____________________________________________  RADIOLOGY Vela Prose, personally viewed and evaluated these images (plain radiographs) as part of my medical decision making, as well as reviewing the written report by the radiologist.  ED MD interpretation: clear lungs , possible left hip fracture but has had replacement   Official radiology report(s): DG Chest Portable 1 View  Result Date: 05/20/2020 CLINICAL DATA:  Hip fracture, preoperative assessment, tripped and fell while pressure washing a house EXAM: PORTABLE CHEST 1 VIEW COMPARISON:  Portable exam 1855 hours without priors for comparison FINDINGS: Normal heart size, mediastinal contours, and pulmonary vascularity. Atherosclerotic calcification aorta. Minimal peribronchial thickening. No infiltrate, pleural effusion, or pneumothorax. Bones demineralized with suspect BILATERAL chronic rotator cuff tears. IMPRESSION: No acute abnormalities. Electronically Signed   By: Ulyses Southward M.D.   On: 05/20/2020 19:27   DG Hip Unilat W or Wo Pelvis 2-3 Views Left  Result Date: 05/20/2020 CLINICAL DATA:  Hip fracture EXAM: DG HIP (WITH OR WITHOUT PELVIS) 2-3V LEFT COMPARISON:  09/13/2018 FINDINGS: Osseous demineralization. Multilevel degenerative disc and  facet disease changes of visualized lower lumbar spine. BILATERAL hip prostheses. Displaced fracture of the proximal LEFT femur involving the greater trochanter and extending into the intertrochanteric region. A large area of lucency is seen surrounding the proximal portion of the femoral component, with asymmetric location of the femoral head of the prosthesis within the acetabular cup suggesting liner wear, question particle disease accounting for lucency. Proximal LEFT femoral diaphysis and lesser trochanter appear intact. No dislocation identified. Visualized pelvis appears intact. Surgical clips in pelvis bilaterally. IMPRESSION: Osseous demineralization with BILATERAL hip prostheses. Fracture identified at proximal LEFT femur involving the intertrochanteric region and greater trochanter, minimally displaced. Underlying lucency at the proximal LEFT femur adjacent to the prosthesis may potentially represent sequela of particle disease as above. Electronically Signed   By: Ulyses Southward M.D.   On: 05/20/2020 19:31    ____________________________________________   PROCEDURES  Procedure(s) performed (including Critical Care):  Procedures   ____________________________________________   INITIAL IMPRESSION / ASSESSMENT AND PLAN / ED COURSE  Theodore Graves was evaluated in Emergency Department on 05/20/2020 for the symptoms described in the history of present illness. He was evaluated in the context of the global COVID-19 pandemic, which necessitated consideration that the patient might be at risk for infection with the SARS-CoV-2 virus that causes COVID-19. Institutional protocols and algorithms that pertain to the evaluation of patients at risk for  COVID-19 are in a state of rapid change based on information released by regulatory bodies including the CDC and federal and state organizations. These policies and algorithms were followed during the patient's care in the ED.    Patient is a 87 year old  who comes in with mechanical fall.  Denies any LOC or syncopal.  Denies chest pain or shortness of breath.  Will get preop labs.  X-ray to evaluate for fracture, dislocation.  Patient did not hit his head he is got no evidence of trauma and patient is alert and oriented and seems like a reliable patient.  No evidence of alcohol intoxication.  This time we will hold off on CT imaging of the head given low suspicion for intercranial hemorrhage.  No cervical spine tenderness to suggest cervical fracture.   Xray  concerning for fracture but I did discuss the case with orthopedics Dr. Odis Luster who states that this looks nonoperative.  Weightbearing as tolerated  Discussed with patient he still continues to have a lot of pain even after fentanyl.  At this time will discuss with hospital team for admission   ____________________________________________   FINAL CLINICAL IMPRESSION(S) / ED DIAGNOSES   Final diagnoses:  Closed fracture of hip, unspecified laterality, initial encounter (HCC)      MEDICATIONS GIVEN DURING THIS VISIT:  Medications  oxyCODONE (Oxy IR/ROXICODONE) immediate release tablet 5 mg (has no administration in time range)  fentaNYL (SUBLIMAZE) injection 50 mcg (50 mcg Intravenous Given 05/20/20 1747)  ondansetron (ZOFRAN) injection 4 mg (4 mg Intravenous Given 05/20/20 1746)  Tdap (BOOSTRIX) injection 0.5 mL (0.5 mLs Intramuscular Given 05/20/20 1747)     ED Discharge Orders    None       Note:  This document was prepared using Dragon voice recognition software and may include unintentional dictation errors.   Concha Se, MD 05/20/20 2008

## 2020-05-20 NOTE — ED Triage Notes (Signed)
Pt from home via ems- pt was pressure washing a house when he tripped and fell onto his left hip. Pt reports pain to left hip. Hx of Bilt hip replacement. Pt denies head injury. A/O x 4

## 2020-05-21 ENCOUNTER — Inpatient Hospital Stay
Admit: 2020-05-21 | Discharge: 2020-05-21 | Disposition: A | Discharge status: Home / Self Care | Payer: Medicare Other | Attending: Internal Medicine | Admitting: Internal Medicine

## 2020-05-21 ENCOUNTER — Encounter: Payer: Self-pay | Admitting: Internal Medicine

## 2020-05-21 ENCOUNTER — Other Ambulatory Visit: Payer: Self-pay

## 2020-05-21 DIAGNOSIS — E785 Hyperlipidemia, unspecified: Secondary | ICD-10-CM

## 2020-05-21 DIAGNOSIS — S7292XS Unspecified fracture of left femur, sequela: Secondary | ICD-10-CM

## 2020-05-21 DIAGNOSIS — R001 Bradycardia, unspecified: Secondary | ICD-10-CM

## 2020-05-21 DIAGNOSIS — E039 Hypothyroidism, unspecified: Secondary | ICD-10-CM

## 2020-05-21 DIAGNOSIS — R110 Nausea: Secondary | ICD-10-CM

## 2020-05-21 DIAGNOSIS — I455 Other specified heart block: Secondary | ICD-10-CM

## 2020-05-21 DIAGNOSIS — S7292XA Unspecified fracture of left femur, initial encounter for closed fracture: Secondary | ICD-10-CM

## 2020-05-21 DIAGNOSIS — R531 Weakness: Secondary | ICD-10-CM

## 2020-05-21 LAB — CBC
HCT: 38.4 % — ABNORMAL LOW (ref 39.0–52.0)
Hemoglobin: 13.1 g/dL (ref 13.0–17.0)
MCH: 30.2 pg (ref 26.0–34.0)
MCHC: 34.1 g/dL (ref 30.0–36.0)
MCV: 88.5 fL (ref 80.0–100.0)
Platelets: 269 10*3/uL (ref 150–400)
RBC: 4.34 MIL/uL (ref 4.22–5.81)
RDW: 13.3 % (ref 11.5–15.5)
WBC: 8.3 10*3/uL (ref 4.0–10.5)
nRBC: 0 % (ref 0.0–0.2)

## 2020-05-21 LAB — BASIC METABOLIC PANEL
Anion gap: 6 (ref 5–15)
BUN: 25 mg/dL — ABNORMAL HIGH (ref 8–23)
CO2: 25 mmol/L (ref 22–32)
Calcium: 8.7 mg/dL — ABNORMAL LOW (ref 8.9–10.3)
Chloride: 105 mmol/L (ref 98–111)
Creatinine, Ser: 0.93 mg/dL (ref 0.61–1.24)
GFR, Estimated: >60 mL/min (ref >60)
Glucose, Bld: 116 mg/dL — ABNORMAL HIGH (ref 70–99)
Potassium: 4.4 mmol/L (ref 3.5–5.1)
Sodium: 136 mmol/L (ref 135–145)

## 2020-05-21 LAB — ECHOCARDIOGRAM COMPLETE
AR max vel: 1.23 cm2
AV Area VTI: 1.08 cm2
AV Area mean vel: 1.16 cm2
AV Mean grad: 29.4 mmHg
AV Peak grad: 45.2 mmHg
Ao pk vel: 3.36 m/s
Area-P 1/2: 3.27 cm2
Height: 67 in
P 1/2 time: 289 msec
S' Lateral: 2.62 cm
Weight: 2857.16 oz

## 2020-05-21 LAB — VITAMIN D 25 HYDROXY (VIT D DEFICIENCY, FRACTURES): Vit D, 25-Hydroxy: 24.78 ng/mL — ABNORMAL LOW (ref 30–100)

## 2020-05-21 MED ORDER — ENOXAPARIN SODIUM 40 MG/0.4ML ~~LOC~~ SOLN
40.0000 mg | SUBCUTANEOUS | Status: DC
  Administered 2020-05-21 – 2020-05-24 (×4): 40 mg via SUBCUTANEOUS
  Filled 2020-05-21 (×4): qty 0.4

## 2020-05-21 MED ORDER — ADULT MULTIVITAMIN W/MINERALS CH
1.0000 | ORAL_TABLET | Freq: Every day | ORAL | Status: DC
  Administered 2020-05-21 – 2020-05-24 (×4): 1 via ORAL
  Filled 2020-05-21 (×4): qty 1

## 2020-05-21 MED ORDER — ENSURE ENLIVE PO LIQD
237.0000 mL | Freq: Two times a day (BID) | ORAL | Status: DC
  Administered 2020-05-21 – 2020-05-23 (×4): 237 mL via ORAL

## 2020-05-21 MED ORDER — AMLODIPINE BESYLATE 5 MG PO TABS
2.5000 mg | ORAL_TABLET | Freq: Every day | ORAL | Status: DC
  Administered 2020-05-21: 2.5 mg via ORAL
  Filled 2020-05-21: qty 1

## 2020-05-21 MED ORDER — ACETAMINOPHEN 325 MG PO TABS
650.0000 mg | ORAL_TABLET | Freq: Four times a day (QID) | ORAL | Status: DC | PRN
  Administered 2020-05-22 – 2020-05-23 (×3): 650 mg via ORAL
  Filled 2020-05-21 (×4): qty 2

## 2020-05-21 MED ORDER — PROCHLORPERAZINE EDISYLATE 10 MG/2ML IJ SOLN
10.0000 mg | Freq: Once | INTRAMUSCULAR | Status: AC
  Administered 2020-05-21: 10 mg via INTRAVENOUS
  Filled 2020-05-21: qty 2

## 2020-05-21 MED ORDER — TRAMADOL HCL 50 MG PO TABS
50.0000 mg | ORAL_TABLET | Freq: Four times a day (QID) | ORAL | Status: DC | PRN
  Administered 2020-05-22 – 2020-05-23 (×4): 50 mg via ORAL
  Filled 2020-05-21 (×4): qty 1

## 2020-05-21 MED ORDER — VITAMIN D 25 MCG (1000 UNIT) PO TABS
1000.0000 [IU] | ORAL_TABLET | Freq: Every day | ORAL | Status: DC
  Administered 2020-05-22 – 2020-05-24 (×3): 1000 [IU] via ORAL
  Filled 2020-05-21 (×3): qty 1

## 2020-05-21 MED ORDER — ONDANSETRON HCL 4 MG/2ML IJ SOLN
4.0000 mg | Freq: Four times a day (QID) | INTRAMUSCULAR | Status: DC | PRN
  Administered 2020-05-21 (×2): 4 mg via INTRAVENOUS
  Filled 2020-05-21 (×2): qty 2

## 2020-05-21 NOTE — Progress Notes (Signed)
Initial Nutrition Assessment  DOCUMENTATION CODES:   Not applicable  INTERVENTION:   -Ensure Enlive po BID, each supplement provides 350 kcal and 20 grams of protein -MVI with minerals daily -Liberalize diet to regular  NUTRITION DIAGNOSIS:   Increased nutrient needs related to acute illness (lt hip fracture) as evidenced by estimated needs.  GOAL:   Patient will meet greater than or equal to 90% of their needs  MONITOR:   PO intake,Supplement acceptance,Labs,Weight trends,Skin,I & O's  REASON FOR ASSESSMENT:   Consult Assessment of nutrition requirement/status  ASSESSMENT:   Theodore Graves is a 87 y.o. male with medical history significant of hypertension, hyperlipidemia  , hypothyroidism  Pt admitted with lt hip fracture s/p mechanical fall.   Reviewed I/O's: +434 ml x 24 hours  UOP: 0 ml x 24 hours  Pt unavailable at time of visit. Attempted to speak with pt via phone, however, unable to reach. Unable to obtain further nutrition-related history or complete nutrition-focused physical exam.  Per orthopedics notes, plan for non-operative treatment.   Reviewed wt hx; wt has been stable over the past year.   Pt would benefit from addition of oral nutrition supplements.   Medications reviewed and include colace.   Labs reviewed.   Diet Order:   Diet Order            Diet Heart Room service appropriate? Yes; Fluid consistency: Thin  Diet effective now                 EDUCATION NEEDS:   No education needs have been identified at this time  Skin:  Skin Assessment: Reviewed RN Assessment  Last BM:  05/20/20  Height:   Ht Readings from Last 1 Encounters:  05/20/20 5\' 7"  (1.702 m)    Weight:   Wt Readings from Last 1 Encounters:  05/20/20 81 kg    Ideal Body Weight:  67.3 kg  BMI:  Body mass index is 27.97 kg/m.  Estimated Nutritional Needs:   Kcal:  1750-1950  Protein:  95-100 grams  Fluid:  > 1.7 L    05/22/20, RD, LDN,  CDCES Registered Dietitian II Certified Diabetes Care and Education Specialist Please refer to [facility] for RD and/or RD on-call/weekend/after hours pager

## 2020-05-21 NOTE — Consult Note (Signed)
Cardiology Consultation Note    Patient ID: RAESHAWN VO, MRN: [id number], DOB/AGE: 1926-09-14 87 y.o. Admit date: 05/20/2020   Date of Consult: 05/21/2020 Primary Physician: Lauro Regulus, MD Primary Cardiologist: none  Chief Complaint: hip fx Reason for Consultation: bradycardia Requesting MD: Dr. Renae Gloss  HPI: Theodore Graves is a 87 y.o. male with history of hyperlipidemia, hypertension and hypothyroidism admitted with a hip fx after a mechanical fall. Was noted to have a periprosthetic left hip fx on xray. EKG on admission nsr at 66 with first degree av block. Since admission has been noted to be in sinus bradycardia with several pauses last pm. He is asymptomatic from these.  He denies syncope or presyncope at home.  He denies any lightheadedness or dizziness.  Review of telemetry shows sinus bradycardia with blocked PACs no prolonged pauses.  Longest of which was approximately 2.1 seconds.  Past Medical History:  Diagnosis Date  . Hyperlipidemia   . Hypertension   . Thyroid condition       Surgical History:  Past Surgical History:  Procedure Laterality Date  . PARTIAL HIP ARTHROPLASTY    . prostate cancer       Home Meds: Prior to Admission medications   Medication Sig Start Date End Date Taking? Authorizing Provider  atorvastatin (LIPITOR) 20 MG tablet Take 20 mg by mouth daily.   Yes [provider]  levothyroxine (SYNTHROID, LEVOTHROID) 50 MCG tablet Take 50 mcg by mouth daily before breakfast.   Yes [provider]  cefUROXime (CEFTIN) 250 MG tablet Take 1 tablet (250 mg total) by mouth 2 (two) times daily with a meal. Patient not taking: No sig reported 09/18/15   Ewing Schlein, MD    Inpatient Medications:  . [START ON 05/22/2020] atorvastatin  20 mg Oral Daily  . docusate sodium  100 mg Oral BID  . enoxaparin (LOVENOX) injection  40 mg Subcutaneous Q24H  . feeding supplement  237 mL Oral BID BM  . levothyroxine  50 mcg Oral QAC  breakfast  . multivitamin with minerals  1 tablet Oral Daily  . senna  1 tablet Oral BID   . methocarbamol (ROBAXIN) IV      Allergies:  Allergies  Allergen Reactions  . Morphine And Related Other (See Comments)    Headache    Social History   Socioeconomic History  . Marital status: Married    Spouse name: Not on file  . Number of children: Not on file  . Years of education: Not on file  . Highest education level: Not on file  Occupational History  . Not on file  Tobacco Use  . Smoking status: Former Games developer  . Smokeless tobacco: Never Used  Substance and Sexual Activity  . Alcohol use: No    Alcohol/week: 0.0 standard drinks  . Drug use: No  . Sexual activity: Not on file  Other Topics Concern  . Not on file  Social History Narrative  . Not on file   Social Determinants of Health   Financial Resource Strain: Not on file  Food Insecurity: Not on file  Transportation Needs: Not on file  Physical Activity: Not on file  Stress: Not on file  Social Connections: Not on file  Intimate Partner Violence: Not on file     Family History  Problem Relation Age of Onset  . Heart disease Mother      Review of Systems: A 12-system review of systems was performed and is negative except as  noted in the HPI.  Labs: No results for input(s): CKTOTAL, CKMB, TROPONINI in the last 72 hours. Lab Results  Component Value Date   WBC 8.3 05/21/2020   HGB 13.1 05/21/2020   HCT 38.4 (L) 05/21/2020   MCV 88.5 05/21/2020   PLT 269 05/21/2020    Recent Labs  Lab 05/20/20 1731 05/21/20 0559  NA 135 136  K 4.5 4.4  CL 102 105  CO2 24 25  BUN 28* 25*  CREATININE 0.95 0.93  CALCIUM 9.1 8.7*  PROT 7.2  --   BILITOT 0.8  --   ALKPHOS 58  --   ALT 17  --   AST 30  --   GLUCOSE 82 116*   No results found for: CHOL, HDL, LDLCALC, TRIG No results found for: DDIMER  Radiology/Studies:  DG Chest Portable 1 View  Result Date: 05/20/2020 CLINICAL DATA:  Hip fracture,  preoperative assessment, tripped and fell while pressure washing a house EXAM: PORTABLE CHEST 1 VIEW COMPARISON:  Portable exam 1855 hours without priors for comparison FINDINGS: Normal heart size, mediastinal contours, and pulmonary vascularity. Atherosclerotic calcification aorta. Minimal peribronchial thickening. No infiltrate, pleural effusion, or pneumothorax. Bones demineralized with suspect BILATERAL chronic rotator cuff tears. IMPRESSION: No acute abnormalities. Electronically Signed   By: Ulyses Southward M.D.   On: 05/20/2020 19:27   DG Hip Unilat W or Wo Pelvis 2-3 Views Left  Result Date: 05/20/2020 CLINICAL DATA:  Hip fracture EXAM: DG HIP (WITH OR WITHOUT PELVIS) 2-3V LEFT COMPARISON:  09/13/2018 FINDINGS: Osseous demineralization. Multilevel degenerative disc and facet disease changes of visualized lower lumbar spine. BILATERAL hip prostheses. Displaced fracture of the proximal LEFT femur involving the greater trochanter and extending into the intertrochanteric region. A large area of lucency is seen surrounding the proximal portion of the femoral component, with asymmetric location of the femoral head of the prosthesis within the acetabular cup suggesting liner wear, question particle disease accounting for lucency. Proximal LEFT femoral diaphysis and lesser trochanter appear intact. No dislocation identified. Visualized pelvis appears intact. Surgical clips in pelvis bilaterally. IMPRESSION: Osseous demineralization with BILATERAL hip prostheses. Fracture identified at proximal LEFT femur involving the intertrochanteric region and greater trochanter, minimally displaced. Underlying lucency at the proximal LEFT femur adjacent to the prosthesis may potentially represent sequela of particle disease as above. Electronically Signed   By: Ulyses Southward M.D.   On: 05/20/2020 19:31    Wt Readings from Last 3 Encounters:  05/20/20 81 kg  10/15/15 81.6 kg  09/18/15 81.6 kg    EKG: Sinus rhythm with AV  block  Physical Exam:  Blood pressure (!) 154/61, pulse (!) 55, temperature (!) 97.4 F (36.3 C), resp. rate 15, height 5\' 7"  (1.702 m), weight 81 kg, SpO2 97 %. Body mass index is 27.97 kg/m. General: Well developed, well nourished, in no acute distress. Head: Normocephalic, atraumatic, sclera non-icteric, no xanthomas, nares are without discharge.  Neck: Negative for carotid bruits. JVD not elevated. Lungs: Clear bilaterally to auscultation without wheezes, rales, or rhonchi. Breathing is unlabored. Heart: RRR with S1 S2. No murmurs, rubs, or gallops appreciated. Abdomen: Soft, non-tender, non-distended with normoactive bowel sounds. No hepatomegaly. No rebound/guarding. No obvious abdominal masses. Msk:  Strength and tone appear normal for age. Extremities: No clubbing or cyanosis. No edema.  Distal pedal pulses are 2+ and equal bilaterally. Neuro: Alert and oriented X 3. No facial asymmetry. No focal deficit. Moves all extremities spontaneously. Psych:  Responds to questions appropriately with a normal affect.  Assessment and Plan  87 year old male with no apparent cardiac history being treated for hypertension hypothyroidism and hyperlipidemia who was admitted after a mechanical fall.  Noted to have a periprosthetic fracture.  Not felt to be surgical candidate.  Is noted to have sinus bradycardia as well as occasional dropped PACs.  He is asymptomatic from this.  He is not currently on any AV nodal medications.  Patient denies any syncope at home.  We will continue to follow on telemetry and consider Holter monitor after discharge.  Based on current data, patient does not appear to be a candidate for a pacemaker however will follow through his hospital stay and as an outpatient.  Signed, Dalia Heading MD 05/21/2020, 1:08 PM Pager: [phone]

## 2020-05-21 NOTE — Progress Notes (Signed)
OT Cancellation Note  Patient Details Name: Theodore Graves MRN: [id number] DOB: [birth year]   Cancelled Treatment:    Reason Eval/Treat Not Completed: Patient declined, no reason specified. Consult received, chart reviewed. Pt declining OT evaluation at this time, despite extensive education in role of OT and the benefit to OT to maximize pt's return to independence. When asked whether he would feel safe/ready to return home after the hospital, he states, "I'll have to see how I feel that day." Pt continues to state he was very independent prior to this fall and, "I just have to heal a little bit and then I'll be able to do what I need to." Ultimately, pt agreeable to OT re-attempting next date.   Wynona Canes, MPH, MS, OTR/L ascom [phone] 05/21/20, 10:51 AM

## 2020-05-21 NOTE — Evaluation (Signed)
Physical Therapy Evaluation Patient Details Name: Theodore Graves MRN: [id number] DOB: 07-26-1926 Today's Date: 05/21/2020   History of Present Illness  87 y.o. male with medical history significant of hypertension, hyperlipidemia  , hypothyroidism.  Pt had a fall while pressure washing and suffered L hip fx (h/o b/l hip replacements decades ago).  Ortho has recommended non-surgical management.  Clinical Impression  Pt was willing to participate with PT but ultimately was very pain limited with all requested activity.  He struggled with any an all L hip movement (active and passive).  Despite pain pt showed good effort in trying to do exercises, mobility.  He was unable to try getting to standing 2/2 pain.     Follow Up Recommendations SNF    Equipment Recommendations   (TBD at next venue of care)    Recommendations for Other Services       Precautions / Restrictions Precautions Precautions: Fall Restrictions Weight Bearing Restrictions: Yes LLE Weight Bearing: Weight bearing as tolerated      Mobility  Bed Mobility Overal bed mobility: Needs Assistance Bed Mobility: Sidelying to Sit;Supine to Sit   Sidelying to sit: Min assist;Mod assist Supine to sit: Mod assist;Max assist     General bed mobility comments: Pt extremely slow and pain limited, good effort but despite this ultimately needed considerable assist to attain standing    Transfers                 General transfer comment: Pt in too much pain to truly attempt getting to standing, struggled to [facility] and shift hips even minimally to the side in the bed.  Ambulation/Gait                Stairs            Wheelchair Mobility    Modified Rankin (Stroke Patients Only)       Balance Overall balance assessment: Needs assistance Sitting-balance support: Bilateral upper extremity supported Sitting balance-Leahy Scale: Good Sitting balance - Comments: Pt is able to maintain sitting balance  EOB relatively well, however he was guarded and in pain the entire time.                                     Pertinent Vitals/Pain Pain Assessment: 0-10 Pain Score: 8     Home Living Family/patient expects to be discharged to:: Unsure Living Arrangements: Spouse/significant other Available Help at Discharge: Available 24 hours/day                  Prior Function                 Hand Dominance        Extremity/Trunk Assessment                Communication      Cognition Arousal/Alertness: Awake/alert Behavior During Therapy: WFL for tasks assessed/performed Overall Cognitive Status: Within Functional Limits for tasks assessed                                        General Comments      Exercises General Exercises - Lower Extremity Ankle Circles/Pumps: AROM Quad Sets: Strengthening;5 reps Heel Slides: PROM;AAROM;5 reps Hip ABduction/ADduction: AAROM;5 reps (very poor tolerance 2/2 pain)   Assessment/Plan    PT Assessment  Patient needs continued PT services  PT Problem List Decreased strength;Decreased activity tolerance;Decreased balance;Pain;Decreased knowledge of use of DME;Decreased safety awareness;Decreased mobility       PT Treatment Interventions Gait training;DME instruction;Stair training;Functional mobility training;Therapeutic activities;Therapeutic exercise;Balance training;Neuromuscular re-education;Patient/family education    PT Goals (Current goals can be found in the Care Plan section)  Acute Rehab PT Goals Patient Stated Goal: Pt would rather not go to rehab and try to manage at home PT Goal Formulation: With patient Time For Goal Achievement: 06/04/20 Potential to Achieve Goals: Fair    Frequency 7X/week   Barriers to discharge        Co-evaluation               AM-PAC PT "6 Clicks" Mobility  Outcome Measure Help needed turning from your back to your side while in a flat bed  without using bedrails?: A Lot Help needed moving from lying on your back to sitting on the side of a flat bed without using bedrails?: A Lot Help needed moving to and from a bed to a chair (including a wheelchair)?: Total Help needed standing up from a chair using your arms (e.g., wheelchair or bedside chair)?: Total Help needed to walk in hospital room?: Total Help needed climbing 3-5 steps with a railing? : Total 6 Click Score: 8    End of Session Equipment Utilized During Treatment: Gait belt Activity Tolerance: Patient limited by pain Patient left: with nursing/sitter in room;with call bell/phone within reach Nurse Communication: Mobility status PT Visit Diagnosis: Muscle weakness (generalized) (M62.81);Difficulty in walking, not elsewhere classified (R26.2)    Time: 6213-0865 PT Time Calculation (min) (ACUTE ONLY): 32 min   Charges:   PT Evaluation $PT Eval Low Complexity: 1 Low PT Treatments $Therapeutic Exercise: 8-22 mins       Malachi Pro, DPT 05/21/2020, 1:10 PM

## 2020-05-21 NOTE — Progress Notes (Signed)
Pt's HR in the 40's; MD notified.

## 2020-05-21 NOTE — Progress Notes (Signed)
   05/21/20 1933  Assess: MEWS Score  Temp 98 F (36.7 C)  BP (!) 190/63  Pulse Rate 60  Resp 17  SpO2 98 %  O2 Device Room Air  Assess: MEWS Score  MEWS Temp 0  MEWS Systolic 0  MEWS Pulse 0  MEWS RR 0  MEWS LOC 0  MEWS Score 0  MEWS Score Color Green  Assess: if the MEWS score is Yellow or Red  Were vital signs taken at a resting state? Yes  Focused Assessment Change from prior assessment (see assessment flowsheet)  Early Detection of Sepsis Score *See Row Information* Low  MEWS guidelines implemented *See Row Information* No, vital signs rechecked  Notify: Charge Nurse/RN  Name of Charge Nurse/RN Notified Linnese, RN  Date Charge Nurse/RN Notified 05/21/20  Time Charge Nurse/RN Notified 2057  Document  Patient Outcome Other (Comment) (Higher level of care)  Progress note created (see row info) Yes

## 2020-05-21 NOTE — Plan of Care (Signed)

## 2020-05-21 NOTE — Progress Notes (Addendum)
Patient ID: Theodore Graves, male   DOB: 1926-09-05, 87 y.o.   MRN: [id number] [facility] PROGRESS NOTE  Theodore Graves RCV:[id number] DOB: 26-Aug-1926 DOA: 05/20/2020 PCP: Lauro Regulus, MD  HPI/Subjective: Patient feeling okay.  Telemetry did note 2 pauses overnight and episodes of bradycardia with heart rate dropping in the 30s and 40s.  When I saw him he was in the 15s.  He felt nauseous with some pain medications.  Objective: Vitals:   05/21/20 0854 05/21/20 1204  BP: (!) 142/61 (!) 154/61  Pulse: (!) 55 (!) 55  Resp: 16 15  Temp: 97.8 F (36.6 C) (!) 97.4 F (36.3 C)  SpO2: 97% 97%    Intake/Output Summary (Last 24 hours) at 05/21/2020 1705 Last data filed at 05/21/2020 1408 Gross per 24 hour  Intake 433.51 ml  Output 0 ml  Net 433.51 ml   Filed Weights   05/20/20 1723  Weight: 81 kg    ROS: Review of Systems  Respiratory: Negative for shortness of breath.   Cardiovascular: Negative for chest pain.  Gastrointestinal: Negative for abdominal pain, nausea and vomiting.   Exam: Physical Exam HENT:     Head: Normocephalic.  Eyes:     General: Lids are normal.     Conjunctiva/sclera: Conjunctivae normal.     Pupils: Pupils are equal, round, and reactive to light.  Cardiovascular:     Rate and Rhythm: Regular rhythm. Bradycardia present.     Heart sounds: S1 normal and S2 normal. Murmur heard.   Systolic murmur is present with a grade of 3/6.   Pulmonary:     Breath sounds: Normal breath sounds. No decreased breath sounds, wheezing, rhonchi or rales.  Abdominal:     Palpations: Abdomen is soft.     Tenderness: There is no abdominal tenderness.  Musculoskeletal:     Right ankle: No swelling.     Left ankle: No swelling.  Skin:    General: Skin is warm.     Findings: No rash.  Neurological:     Mental Status: He is alert and oriented to person, place, and time.       Data Reviewed: Basic Metabolic Panel: Recent Labs  Lab 05/20/20 1731  05/21/20 0559  NA 135 136  K 4.5 4.4  CL 102 105  CO2 24 25  GLUCOSE 82 116*  BUN 28* 25*  CREATININE 0.95 0.93  CALCIUM 9.1 8.7*   Liver Function Tests: Recent Labs  Lab 05/20/20 1731  AST 30  ALT 17  ALKPHOS 58  BILITOT 0.8  PROT 7.2  ALBUMIN 4.3   CBC: Recent Labs  Lab 05/20/20 1731 05/21/20 0559  WBC 8.6 8.3  NEUTROABS 5.9  --   HGB 13.4 13.1  HCT 39.6 38.4*  MCV 88.0 88.5  PLT 307 269     Recent Results (from the past 240 hour(s))  Resp Panel by RT-PCR (Flu A&B, Covid) Nasopharyngeal Swab     Status: None   Collection Time: 05/20/20  5:31 PM   Specimen: Nasopharyngeal Swab; Nasopharyngeal(NP) swabs in vial transport medium  Result Value Ref Range Status   SARS Coronavirus 2 by RT PCR NEGATIVE NEGATIVE Final    Comment: (NOTE) SARS-CoV-2 target nucleic acids are NOT DETECTED.  The SARS-CoV-2 RNA is generally detectable in upper respiratory specimens during the acute phase of infection. The lowest concentration of SARS-CoV-2 viral copies this assay can detect is 138 copies/mL. A negative result does not preclude SARS-Cov-2 infection and should not be used  as the sole basis for treatment or other patient management decisions. A negative result may occur with  improper specimen collection/handling, submission of specimen other than nasopharyngeal swab, presence of viral mutation(s) within the areas targeted by this assay, and inadequate number of viral copies(<138 copies/mL). A negative result must be combined with clinical observations, patient history, and epidemiological information. The expected result is Negative.  Fact Sheet for Patients:  BloggerCourse.com  Fact Sheet for Healthcare Providers:  SeriousBroker.it  This test is no t yet approved or cleared by the [place] FDA and  has been authorized for detection and/or diagnosis of SARS-CoV-2 by FDA under an Emergency Use Authorization  (EUA). This EUA will remain  in effect (meaning this test can be used) for the duration of the COVID-19 declaration under Section 564(b)(1) of the Act, 21 U.S.C.section 360bbb-3(b)(1), unless the authorization is terminated  or revoked sooner.       Influenza A by PCR NEGATIVE NEGATIVE Final   Influenza B by PCR NEGATIVE NEGATIVE Final    Comment: (NOTE) The Xpert Xpress SARS-CoV-2/FLU/RSV plus assay is intended as an aid in the diagnosis of influenza from Nasopharyngeal swab specimens and should not be used as a sole basis for treatment. Nasal washings and aspirates are unacceptable for Xpert Xpress SARS-CoV-2/FLU/RSV testing.  Fact Sheet for Patients: BloggerCourse.com  Fact Sheet for Healthcare Providers: SeriousBroker.it  This test is not yet approved or cleared by the [place] FDA and has been authorized for detection and/or diagnosis of SARS-CoV-2 by FDA under an Emergency Use Authorization (EUA). This EUA will remain in effect (meaning this test can be used) for the duration of the COVID-19 declaration under Section 564(b)(1) of the Act, 21 U.S.C. section 360bbb-3(b)(1), unless the authorization is terminated or revoked.  Performed at [facility], [address] [place], [place] [address]      Studies: DG Chest Portable 1 View  Result Date: 05/20/2020 CLINICAL DATA:  Hip fracture, preoperative assessment, tripped and fell while pressure washing a house EXAM: PORTABLE CHEST 1 VIEW COMPARISON:  Portable exam 1855 hours without priors for comparison FINDINGS: Normal heart size, mediastinal contours, and pulmonary vascularity. Atherosclerotic calcification aorta. Minimal peribronchial thickening. No infiltrate, pleural effusion, or pneumothorax. Bones demineralized with suspect BILATERAL chronic rotator cuff tears. IMPRESSION: No acute abnormalities. Electronically Signed   By: Ulyses Southward M.D.   On:  05/20/2020 19:27   DG Hip Unilat W or Wo Pelvis 2-3 Views Left  Result Date: 05/20/2020 CLINICAL DATA:  Hip fracture EXAM: DG HIP (WITH OR WITHOUT PELVIS) 2-3V LEFT COMPARISON:  09/13/2018 FINDINGS: Osseous demineralization. Multilevel degenerative disc and facet disease changes of visualized lower lumbar spine. BILATERAL hip prostheses. Displaced fracture of the proximal LEFT femur involving the greater trochanter and extending into the intertrochanteric region. A large area of lucency is seen surrounding the proximal portion of the femoral component, with asymmetric location of the femoral head of the prosthesis within the acetabular cup suggesting liner wear, question particle disease accounting for lucency. Proximal LEFT femoral diaphysis and lesser trochanter appear intact. No dislocation identified. Visualized pelvis appears intact. Surgical clips in pelvis bilaterally. IMPRESSION: Osseous demineralization with BILATERAL hip prostheses. Fracture identified at proximal LEFT femur involving the intertrochanteric region and greater trochanter, minimally displaced. Underlying lucency at the proximal LEFT femur adjacent to the prosthesis may potentially represent sequela of particle disease as above. Electronically Signed   By: Ulyses Southward M.D.   On: 05/20/2020 19:31    Scheduled Meds: . Melene Muller  ON 05/22/2020] atorvastatin  20 mg Oral Daily  . docusate sodium  100 mg Oral BID  . enoxaparin (LOVENOX) injection  40 mg Subcutaneous Q24H  . feeding supplement  237 mL Oral BID BM  . levothyroxine  50 mcg Oral QAC breakfast  . multivitamin with minerals  1 tablet Oral Daily  . senna  1 tablet Oral BID   Continuous Infusions: . methocarbamol (ROBAXIN) IV     Brief history.  Patient admitted 05/20/2020 with fall and found to have left proximal femur fracture with his hip prosthesis.  Patient had some sinus pauses overnight and some bradycardia.  Past medical history of hyperlipidemia and  hypothyroidism.  Assessment/Plan:  1. Fracture left proximal femur with hip prosthesis.  Medical management.  Physical therapy and pain control.  Switch pain medication over the tramadol and see how he does.  If not we will have to be Tylenol. 2. Nausea with pain medications.  IV Zofran as needed 3. Bradycardia and sinus pauses overnight.  Not on any rate controlling medications.  Will get echocardiogram with heart murmur.  Appreciate cardiology consultation. 4. Hypothyroidism unspecified on levothyroxine 5. Hyperlipidemia unspecified on atorvastatin 6. Weakness.  Physical therapy recommending rehab 7. Essential hypertension.  Start low-dose Norvasc 2.5 daily.  Blood pressure likely elevated with pain 8. Vitamin D deficiency start oral vitamin D tomorrow     Code Status:     Code Status Orders  (From admission, onward)         Start     Ordered   05/20/20 2347  Do not attempt resuscitation (DNR)  Continuous       Question Answer Comment  In the event of cardiac or respiratory ARREST Do not call a "code blue"   In the event of cardiac or respiratory ARREST Do not perform Intubation, CPR, defibrillation or ACLS   In the event of cardiac or respiratory ARREST Use medication by any route, position, wound care, and other measures to relive pain and suffering. May use oxygen, suction and manual treatment of airway obstruction as needed for comfort.      05/20/20 2347        Code Status History    This patient has a current code status but no historical code status.   Advance Care Planning Activity    Advance Directive Documentation   Flowsheet Row Most Recent Value  Type of Advance Directive Living will  Pre-existing out of facility DNR order (yellow form or pink MOST form) --  "MOST" Form in Place? --     Family Communication: Spoke with wife on the phone Disposition Plan: Status is: Inpatient  Dispo: The patient is from: Home              Anticipated d/c is to: Rehab               Patient currently having quite a bit of pain in his hip.   Difficult to place patient.  No.  Time spent: 28 minutes  [employer]

## 2020-05-22 DIAGNOSIS — S7292XD Unspecified fracture of left femur, subsequent encounter for closed fracture with routine healing: Secondary | ICD-10-CM

## 2020-05-22 LAB — BASIC METABOLIC PANEL
Anion gap: 9 (ref 5–15)
BUN: 18 mg/dL (ref 8–23)
CO2: 25 mmol/L (ref 22–32)
Calcium: 8.9 mg/dL (ref 8.9–10.3)
Chloride: 101 mmol/L (ref 98–111)
Creatinine, Ser: 0.76 mg/dL (ref 0.61–1.24)
GFR, Estimated: >60 mL/min (ref >60)
Glucose, Bld: 99 mg/dL (ref 70–99)
Potassium: 4 mmol/L (ref 3.5–5.1)
Sodium: 135 mmol/L (ref 135–145)

## 2020-05-22 LAB — CBC
HCT: 40.8 % (ref 39.0–52.0)
Hemoglobin: 13.5 g/dL (ref 13.0–17.0)
MCH: 29.7 pg (ref 26.0–34.0)
MCHC: 33.1 g/dL (ref 30.0–36.0)
MCV: 89.9 fL (ref 80.0–100.0)
Platelets: 293 10*3/uL (ref 150–400)
RBC: 4.54 MIL/uL (ref 4.22–5.81)
RDW: 13.4 % (ref 11.5–15.5)
WBC: 9.6 10*3/uL (ref 4.0–10.5)
nRBC: 0 % (ref 0.0–0.2)

## 2020-05-22 MED ORDER — AMLODIPINE BESYLATE 5 MG PO TABS
5.0000 mg | ORAL_TABLET | Freq: Every day | ORAL | Status: DC
  Administered 2020-05-22 – 2020-05-24 (×3): 5 mg via ORAL
  Filled 2020-05-22 (×3): qty 1

## 2020-05-22 MED ORDER — GABAPENTIN 100 MG PO CAPS
200.0000 mg | ORAL_CAPSULE | Freq: Three times a day (TID) | ORAL | Status: DC
  Administered 2020-05-22 – 2020-05-23 (×6): 200 mg via ORAL
  Filled 2020-05-22 (×6): qty 2

## 2020-05-22 NOTE — Progress Notes (Signed)
Physical Therapy Treatment Patient Details Name: Theodore Graves MRN: [id number] DOB: [birth year] Today's Date: 05/22/2020    History of Present Illness 87 y.o. male with medical history significant of hypertension, hyperlipidemia  , hypothyroidism.  Pt had a fall while pressure washing and suffered L hip fx (h/o b/l hip replacements decades ago).  Ortho has recommended non-surgical management.    PT Comments    Pt continues to show good effort, but does not tolerate WBing through L very well.  He was not able to pick foot up and even with great effort and heavy cuing he could only heel-toe L with pain and hesitancy.  He additionally had considerable pain with supine exercises, but despite this showed good effort.   Follow Up Recommendations  SNF     Equipment Recommendations  None recommended by PT    Recommendations for Other Services       Precautions / Restrictions Precautions Precautions: Fall Restrictions Weight Bearing Restrictions: Yes LLE Weight Bearing: Weight bearing as tolerated    Mobility  Bed Mobility Overal bed mobility: Needs Assistance Bed Mobility: Sit to Supine   Sidelying to sit: Min assist;HOB elevated;Mod assist   Sit to supine: Min assist;Mod assist   General bed mobility comments: he made good effort to use L LE to assist raising R, ultimately needed assist to get LEs up into the bed    Transfers Overall transfer level: Needs assistance Equipment used: Rolling walker (2 wheeled) Transfers: Sit to/from Stand Sit to Stand: Min assist;From elevated surface Stand pivot transfers: Min assist;+2 physical assistance;From elevated surface       General transfer comment: Pt did not need a lot of physical assist to get to standing but was hesitant to do a lot of WBing on the L and was highly reliant on the walker with poor confidence and increased pain in WBing  Ambulation/Gait             General Gait Details: Pt struggled to do anything that  would be called ambulation.  Even with heavy cuing and assist he was unable to clear L foot off ground, he struggled with slow and labored heel-toe shifting with it to go chair to bed.   Stairs             Wheelchair Mobility    Modified Rankin (Stroke Patients Only)       Balance Overall balance assessment: Needs assistance Sitting-balance support: Bilateral upper extremity supported Sitting balance-Leahy Scale: Good Sitting balance - Comments: Pt is able to maintain sitting balance EOB relatively well, however he was guarded and in pain the entire time.   Standing balance support: Bilateral upper extremity supported Standing balance-Leahy Scale: Poor Standing balance comment: heavy BUE reliance on RW                            Cognition Arousal/Alertness: Awake/alert Behavior During Therapy: WFL for tasks assessed/performed Overall Cognitive Status: Within Functional Limits for tasks assessed                                 General Comments: fearful of pain      Exercises General Exercises - Lower Extremity Ankle Circles/Pumps: AROM;10 reps Quad Sets: Strengthening;10 reps Short Arc Quad: AROM;10 reps Heel Slides: AAROM;10 reps Hip ABduction/ADduction: AAROM;10 reps Other Exercises Other Exercises: Pt instructed in bed mobility, ADL transfer training, RW mgt,  falls prevention    General Comments        Pertinent Vitals/Pain Pain Assessment: 0-10 Pain Score: 8  Pain Location: Lhip/groin Pain Descriptors / Indicators: Aching Pain Intervention(s): Monitored during session;Limited activity within patient's tolerance;Premedicated before session;Repositioned    Home Living Family/patient expects to be discharged to:: Unsure Living Arrangements: Spouse/significant other Available Help at Discharge: Available 24 hours/day                Prior Function Level of Independence: Independent      Comments: Pt independent at  baseline, no other falls outside of pressure washing patio and falling   PT Goals (current goals can now be found in the care plan section) Acute Rehab PT Goals Patient Stated Goal: Pt would rather not go to rehab and try to manage at home Progress towards PT goals: Progressing toward goals    Frequency    7X/week      PT Plan Current plan remains appropriate    Co-evaluation              AM-PAC PT "6 Clicks" Mobility   Outcome Measure  Help needed turning from your back to your side while in a flat bed without using bedrails?: A Lot Help needed moving from lying on your back to sitting on the side of a flat bed without using bedrails?: A Lot Help needed moving to and from a bed to a chair (including a wheelchair)?: Total Help needed standing up from a chair using your arms (e.g., wheelchair or bedside chair)?: Total Help needed to walk in hospital room?: Total Help needed climbing 3-5 steps with a railing? : Total 6 Click Score: 8    End of Session Equipment Utilized During Treatment: Gait belt Activity Tolerance: Patient limited by pain Patient left: with nursing/sitter in room;with call bell/phone within reach   PT Visit Diagnosis: Muscle weakness (generalized) (M62.81);Difficulty in walking, not elsewhere classified (R26.2)     Time: 7824-2353 PT Time Calculation (min) (ACUTE ONLY): 25 min  Charges:  $Therapeutic Exercise: 8-22 mins $Therapeutic Activity: 8-22 mins                     Malachi Pro, DPT 05/22/2020, 1:29 PM

## 2020-05-22 NOTE — Progress Notes (Signed)
Nutrition Follow-up  DOCUMENTATION CODES:   Not applicable  INTERVENTION:   -Continue liberalized diet of regular -Continue MVI with minerals daily -Continue Ensure Enlive po BID, each supplement provides 350 kcal and 20 grams of protein  NUTRITION DIAGNOSIS:   Increased nutrient needs related to acute illness (lt hip fracture) as evidenced by estimated needs.  Ongoing  GOAL:   Patient will meet greater than or equal to 90% of their needs  Progressing   MONITOR:   PO intake,Supplement acceptance,Labs,Weight trends,Skin,I & O's  REASON FOR ASSESSMENT:   Consult Assessment of nutrition requirement/status  ASSESSMENT:   Theodore Graves is a 87 y.o. male with medical history significant of hypertension, hyperlipidemia  , hypothyroidism  Reviewed I/O's: -200 ml x 24 hours and +234 ml since admission  UOP: 200 ml x 24 hours  Spoke with pt at bedside, who was pleasant and in good spirits today. He reports decreased appetite since hospital stay, reporting he does not feel like eating. He shares he ate a few bites of sausage and french toast today, but really enjoys drinking the Ensure supplements.   PTA, he reports good appetite, consuming 3 meals per day (Breakfast: eggs and toast; Lunch soup and sandwich; Dinner: pizza or meat, starch, and vegetable).   Pt denies any weight loss. He reports mild weight gain over the past year due to decreased activity, however, pt reports he still likes to be as active as possible.   Discussed importance of good meal and supplement intake to promote healing.   Medications reviewed and include vitamin D3, colace, lovenox, and senokot.    Labs reviewed.   NUTRITION - FOCUSED PHYSICAL EXAM:  Flowsheet Row Most Recent Value  Orbital Region No depletion  Upper Arm Region No depletion  Thoracic and Lumbar Region No depletion  Buccal Region No depletion  Temple Region No depletion  Clavicle Bone Region No depletion  Clavicle and  Acromion Bone Region No depletion  Scapular Bone Region No depletion  Dorsal Hand No depletion  Patellar Region No depletion  Anterior Thigh Region No depletion  Posterior Calf Region No depletion  Edema (RD Assessment) None  Hair Reviewed  Eyes Reviewed  Mouth Reviewed  Skin Reviewed  Nails Reviewed       Diet Order:   Diet Order            Diet regular Room service appropriate? Yes with Assist; Fluid consistency: Thin  Diet effective now                 EDUCATION NEEDS:   No education needs have been identified at this time  Skin:  Skin Assessment: Reviewed RN Assessment  Last BM:  05/20/20  Height:   Ht Readings from Last 1 Encounters:  05/20/20 5\' 7"  (1.702 m)    Weight:   Wt Readings from Last 1 Encounters:  05/20/20 81 kg    Ideal Body Weight:  67.3 kg  BMI:  Body mass index is 27.97 kg/m.  Estimated Nutritional Needs:   Kcal:  1750-1950  Protein:  95-100 grams  Fluid:  > 1.7 L    05/22/20, RD, LDN, CDCES Registered Dietitian II Certified Diabetes Care and Education Specialist Please refer to [facility] for RD and/or RD on-call/weekend/after hours pager

## 2020-05-22 NOTE — Plan of Care (Signed)

## 2020-05-22 NOTE — Progress Notes (Signed)
Patient Name: Theodore Graves Date of Encounter: 05/22/2020  Hospital Problem List     Active Problems:   Hip fracture [facility])   Hypothyroidism (acquired)   Hyperlipidemia   Closed fracture of left femur (HCC)   Nausea   Bradycardia   Sinus pause   Weakness    Patient Profile     87 y.o. male with history of hyperlipidemia, hypertension and hypothyroidism admitted with a hip fx after a mechanical fall. Was noted to have a periprosthetic left hip fx on xray. EKG on admission nsr at 66 with first degree av block. Since admission has been noted to be in sinus bradycardia with several pauses last pm. He is asymptomatic from these.  He denies syncope or presyncope at home.  He denies any lightheadedness or dizziness.  Review of telemetry shows sinus bradycardia with blocked PACs no prolonged pauses.  Longest of which was approximately 2.1 seconds.  Subjective   Asymptomatic from a cardiac standpoint  Inpatient Medications    . amLODipine  5 mg Oral Daily  . atorvastatin  20 mg Oral Daily  . cholecalciferol  1,000 Units Oral Daily  . docusate sodium  100 mg Oral BID  . enoxaparin (LOVENOX) injection  40 mg Subcutaneous Q24H  . feeding supplement  237 mL Oral BID BM  . gabapentin  200 mg Oral TID  . levothyroxine  50 mcg Oral QAC breakfast  . multivitamin with minerals  1 tablet Oral Daily  . senna  1 tablet Oral BID    Vital Signs    Vitals:   05/21/20 2316 05/22/20 0406 05/22/20 0759 05/22/20 1123  BP: (!) 157/67 (!) 157/54 (!) 155/61 (!) 142/65  Pulse: 60 (!) 58 (!) 56 66  Resp: 17 16 19 18   Temp: 97.9 F (36.6 C) 97.6 F (36.4 C) 97.6 F (36.4 C) 98.8 F (37.1 C)  TempSrc:  Oral Oral   SpO2: 98% 95% 98% 96%  Weight:      Height:        Intake/Output Summary (Last 24 hours) at 05/22/2020 1512 Last data filed at 05/22/2020 1405 Gross per 24 hour  Intake 240 ml  Output 200 ml  Net 40 ml   Filed Weights   05/20/20 1723  Weight: 81 kg    Physical Exam     GEN: Well nourished, well developed, in no acute distress.  HEENT: normal.  Neck: Supple, no JVD, carotid bruits, or masses. Cardiac: RRR, no murmurs, rubs, or gallops. No clubbing, cyanosis, edema.  Radials/DP/PT 2+ and equal bilaterally.  Respiratory:  Respirations regular and unlabored, clear to auscultation bilaterally. GI: Soft, nontender, nondistended, BS + x 4. MS: no deformity or atrophy. Skin: warm and dry, no rash. Neuro:  Strength and sensation are intact. Psych: Normal affect.  Labs    CBC Recent Labs    05/20/20 1731 05/21/20 0559 05/22/20 0413  WBC 8.6 8.3 9.6  NEUTROABS 5.9  --   --   HGB 13.4 13.1 13.5  HCT 39.6 38.4* 40.8  MCV 88.0 88.5 89.9  PLT 307 269 293   Basic Metabolic Panel Recent Labs    05/24/20 0559 05/22/20 0413  NA 136 135  K 4.4 4.0  CL 105 101  CO2 25 25  GLUCOSE 116* 99  BUN 25* 18  CREATININE 0.93 0.76  CALCIUM 8.7* 8.9   Liver Function Tests Recent Labs    05/20/20 1731  AST 30  ALT 17  ALKPHOS 58  BILITOT 0.8  PROT 7.2  ALBUMIN 4.3   No results for input(s): LIPASE, AMYLASE in the last 72 hours. Cardiac Enzymes No results for input(s): CKTOTAL, CKMB, CKMBINDEX, TROPONINI in the last 72 hours. BNP No results for input(s): BNP in the last 72 hours. D-Dimer No results for input(s): DDIMER in the last 72 hours. Hemoglobin A1C No results for input(s): HGBA1C in the last 72 hours. Fasting Lipid Panel No results for input(s): CHOL, HDL, LDLCALC, TRIG, CHOLHDL, LDLDIRECT in the last 72 hours. Thyroid Function Tests No results for input(s): TSH, T4TOTAL, T3FREE, THYROIDAB in the last 72 hours.  Invalid input(s): FREET3  Telemetry    Sinus bradycardia/sinus rhythm with no prolonged pauses  ECG    Sinus bradycardia  Radiology    DG Chest Portable 1 View  Result Date: 05/20/2020 CLINICAL DATA:  Hip fracture, preoperative assessment, tripped and fell while pressure washing a house EXAM: PORTABLE CHEST 1 VIEW  COMPARISON:  Portable exam 1855 hours without priors for comparison FINDINGS: Normal heart size, mediastinal contours, and pulmonary vascularity. Atherosclerotic calcification aorta. Minimal peribronchial thickening. No infiltrate, pleural effusion, or pneumothorax. Bones demineralized with suspect BILATERAL chronic rotator cuff tears. IMPRESSION: No acute abnormalities. Electronically Signed   By: Ulyses Southward M.D.   On: 05/20/2020 19:27   ECHOCARDIOGRAM COMPLETE  Result Date: 05/21/2020    ECHOCARDIOGRAM REPORT   Patient Name:   Theodore Graves [facility] Date of Exam: 05/21/2020 Medical Rec #:  [id number]      Height:       67.0 in Accession #:    [id number]     Weight:       178.6 lb Date of Birth:  [birth year]       BSA:          1.927 m Patient Age:    89 years       BP:           190/63 mmHg Patient Gender: M              HR:           64 bpm. Exam Location:  [facility] Procedure: 2D Echo, Cardiac Doppler and Color Doppler Indications:     R01.2 Abnormal heart sounds  History:         Patient has no prior history of Echocardiogram examinations.                  Risk Factors:Hypertension and Dyslipidemia.  Sonographer:     Sedonia Small Rodgers-Jones Referring Phys:  [id number] Alford Highland Diagnosing Phys: Harold Hedge MD IMPRESSIONS  1. Left ventricular ejection fraction, by estimation, is 60 to 65%. The left ventricle has normal function. The left ventricle has no regional wall motion abnormalities. Left ventricular diastolic parameters are consistent with Grade I diastolic dysfunction (impaired relaxation).  2. Right ventricular systolic function is normal. The right ventricular size is mildly enlarged.  3. Left atrial size was mildly dilated.  4. The mitral valve is grossly normal. Trivial mitral valve regurgitation.  5. The aortic valve is calcified. Aortic valve regurgitation is trivial. Moderate aortic valve stenosis. FINDINGS  Left Ventricle: Left ventricular ejection fraction, by estimation, is 60 to 65%. The left ventricle  has normal function. The left ventricle has no regional wall motion abnormalities. The left ventricular internal cavity size was normal in size. There is  borderline left ventricular hypertrophy. Left ventricular diastolic parameters are consistent with Grade I diastolic dysfunction (impaired relaxation). Right Ventricle: The right ventricular size is mildly enlarged. No  increase in right ventricular wall thickness. Right ventricular systolic function is normal. Left Atrium: Left atrial size was mildly dilated. Right Atrium: Right atrial size was normal in size. Pericardium: There is no evidence of pericardial effusion. Mitral Valve: The mitral valve is grossly normal. Trivial mitral valve regurgitation. Tricuspid Valve: The tricuspid valve is not well visualized. Tricuspid valve regurgitation is mild. Aortic Valve: The aortic valve is calcified. Aortic valve regurgitation is trivial. Aortic regurgitation PHT measures 289 msec. Moderate aortic stenosis is present. Aortic valve mean gradient measures 29.4 mmHg. Aortic valve peak gradient measures 45.2 mmHg. Aortic valve area, by VTI measures 1.08 cm. Pulmonic Valve: The pulmonic valve was not well visualized. Pulmonic valve regurgitation is trivial. Aorta: The aortic root was not well visualized. IAS/Shunts: The interatrial septum was not assessed.  LEFT VENTRICLE PLAX 2D LVIDd:         3.79 cm  Diastology LVIDs:         2.62 cm  LV e' medial:    7.40 cm/s LV PW:         0.83 cm  LV E/e' medial:  12.3 LV IVS:        0.98 cm  LV e' lateral:   7.72 cm/s LVOT diam:     2.20 cm  LV E/e' lateral: 11.8 LV SV:         88 LV SV Index:   46 LVOT Area:     3.80 cm  RIGHT VENTRICLE RV Basal diam:  3.02 cm RV S prime:     12.40 cm/s TAPSE (M-mode): 2.0 cm LEFT ATRIUM             Index       RIGHT ATRIUM           Index LA diam:        4.10 cm 2.13 cm/m  RA Area:     11.80 cm LA Vol (A2C):   45.8 ml 23.77 ml/m RA Volume:   28.00 ml  14.53 ml/m LA Vol (A4C):   40.5 ml 21.02  ml/m LA Biplane Vol: 44.9 ml 23.30 ml/m  AORTIC VALVE AV Area (Vmax):    1.23 cm AV Area (Vmean):   1.16 cm AV Area (VTI):     1.08 cm AV Vmax:           336.20 cm/s AV Vmean:          260.000 cm/s AV VTI:            0.813 m AV Peak Grad:      45.2 mmHg AV Mean Grad:      29.4 mmHg LVOT Vmax:         109.00 cm/s LVOT Vmean:        79.300 cm/s LVOT VTI:          0.231 m LVOT/AV VTI ratio: 0.28 AI PHT:            289 msec  AORTA Ao Root diam: 3.40 cm Ao Asc diam:  3.30 cm MITRAL VALVE MV Area (PHT): 3.27 cm     SHUNTS MV Decel Time: 232 msec     Systemic VTI:  0.23 m MV E velocity: 91.30 cm/s   Systemic Diam: 2.20 cm MV A velocity: 131.00 cm/s MV E/A ratio:  0.70 Harold Hedge MD Electronically signed by Harold Hedge MD Signature Date/Time: 05/21/2020/8:30:58 PM    Final    DG Hip Unilat W or Wo Pelvis 2-3 Views Left  Result Date:  05/20/2020 CLINICAL DATA:  Hip fracture EXAM: DG HIP (WITH OR WITHOUT PELVIS) 2-3V LEFT COMPARISON:  09/13/2018 FINDINGS: Osseous demineralization. Multilevel degenerative disc and facet disease changes of visualized lower lumbar spine. BILATERAL hip prostheses. Displaced fracture of the proximal LEFT femur involving the greater trochanter and extending into the intertrochanteric region. A large area of lucency is seen surrounding the proximal portion of the femoral component, with asymmetric location of the femoral head of the prosthesis within the acetabular cup suggesting liner wear, question particle disease accounting for lucency. Proximal LEFT femoral diaphysis and lesser trochanter appear intact. No dislocation identified. Visualized pelvis appears intact. Surgical clips in pelvis bilaterally. IMPRESSION: Osseous demineralization with BILATERAL hip prostheses. Fracture identified at proximal LEFT femur involving the intertrochanteric region and greater trochanter, minimally displaced. Underlying lucency at the proximal LEFT femur adjacent to the prosthesis may potentially  represent sequela of particle disease as above. Electronically Signed   By: Ulyses SouthwardMark  Boles M.D.   On: 05/20/2020 19:31    Assessment & Plan    87 year old male with no apparent cardiac history being treated for hypertension hypothyroidism and hyperlipidemia who was admitted after a mechanical fall.  Noted to have a periprosthetic fracture.  Not felt to be surgical candidate.  Is noted to have sinus bradycardia as well as occasional dropped PACs.  He is asymptomatic from this.  He is not currently on any AV nodal medications.  Patient denies any syncope at home.  We will continue to follow on telemetry and consider Holter monitor after discharge.  Based on current data, patient does not appear to be a candidate for a pacemaker however will follow through his hospital stay and as an outpatient.  We will continue to follow.  Signed, Darlin PriestlyKenneth A. Novah Goza MD 05/22/2020, 3:12 PM  Pager: [phone]

## 2020-05-22 NOTE — Progress Notes (Signed)
PROGRESS NOTE    Theodore Graves  ZOX:[id number] DOB: [birth year] DOA: 05/20/2020 PCP: Lauro Regulus, MD  Brief Narrative:  87 year old male history of hypertension, hyperlipidemia presented after mechanical fall.  No head injury or loss of consciousness.  Found to have left proximal her periprosthetic hip fracture.  Seen by orthopedic surgery who recommended medical management.  Patient has been seen by physical therapy and is gradually improving however has had issues with various narcotics.  Seems to be better controlled.  Also some issues with bradycardia and sinus pauses.  Cardiology consulted with no changes in inpatient management.  Patient is not on any AV nodal blocking agents and is asymptomatic during these episodes.  Cardiology recommending outpatient Holter monitor, however will monitor on telemetry during hospitalization.   Assessment & Plan:   Active Problems:   Hip fracture (HCC)   Hypothyroidism (acquired)   Hyperlipidemia   Closed fracture of left femur (HCC)   Nausea   Bradycardia   Sinus pause   Weakness  1. Fracture left proximal femur with hip prosthesis.    Orthopedics evaluated and recommending medical management.  Has been progressing slowly with physical therapy.  Pain control is been an issue due to various intolerances.  Seems to be well controlled now.  Continue current plan of care.  Antiemetics as needed.  Plan for skilled nursing facility at time of discharge.  Possibly within 24 hours.  2. Bradycardia and sinus pauses.  Cardiology consulted, recommendations appreciated.  Patient not on any AV nodal blocking agents and asymptomatic during these episodes.  Echocardiogram reassuring. 3. Hypothyroidism unspecified on levothyroxine 4. Hyperlipidemia unspecified on atorvastatin 5. Weakness.  Physical therapy recommending rehab.  Possible discharge in 24 hours 6. Essential hypertension.    Likely elevated due to pain.  Increase Norvasc to 5 mg daily Vitamin  D deficiency start oral vitamin D tomorrow    DVT prophylaxis: Lovenox Code Status: DNR Family Communication: Wife Larita Fife [phone] on 3/23 Disposition Plan: Status is: Inpatient  Remains inpatient appropriate because:Inpatient level of care appropriate due to severity of illness   Dispo: The patient is from: Home              Anticipated d/c is to: SNF              Patient currently is not medically stable to d/c.   Difficult to place patient No       Level of care: Med-Surg  Consultants:   Orthopedics  Cardiology  Procedures:   None  Antimicrobials:   None   Subjective: Seen and examined.  Improved pain control.  No other complaints.  Objective: Vitals:   05/21/20 2316 05/22/20 0406 05/22/20 0759 05/22/20 1123  BP: (!) 157/67 (!) 157/54 (!) 155/61 (!) 142/65  Pulse: 60 (!) 58 (!) 56 66  Resp: 17 16 19 18   Temp: 97.9 F (36.6 C) 97.6 F (36.4 C) 97.6 F (36.4 C) 98.8 F (37.1 C)  TempSrc:  Oral Oral   SpO2: 98% 95% 98% 96%  Weight:      Height:        Intake/Output Summary (Last 24 hours) at 05/22/2020 1339 Last data filed at 05/21/2020 2212 Gross per 24 hour  Intake 0 ml  Output 200 ml  Net -200 ml   Filed Weights   05/20/20 1723  Weight: 81 kg    Examination:  General exam: Appears calm and comfortable  Respiratory system: Clear to auscultation. Respiratory effort normal. Cardiovascular system: S1-S2.+ Murmur 3/6  Gastrointestinal system: Abdomen is nondistended, soft and nontender. No organomegaly or masses felt. Normal bowel sounds heard. Central nervous system: Alert and oriented. No focal neurological deficits. Extremities: Symmetric 5 x 5 power. Skin: No rashes, lesions or ulcers Psychiatry: Judgement and insight appear normal. Mood & affect appropriate.     Data Reviewed: I have personally reviewed following labs and imaging studies  CBC: Recent Labs  Lab 05/20/20 1731 05/21/20 0559 05/22/20 0413  WBC 8.6 8.3 9.6   NEUTROABS 5.9  --   --   HGB 13.4 13.1 13.5  HCT 39.6 38.4* 40.8  MCV 88.0 88.5 89.9  PLT 307 269 293   Basic Metabolic Panel: Recent Labs  Lab 05/20/20 1731 05/21/20 0559 05/22/20 0413  NA 135 136 135  K 4.5 4.4 4.0  CL 102 105 101  CO2 GLUCOSE 82 116* 99  BUN 28* 25* 18  CREATININE 0.95 0.93 0.76  CALCIUM 9.1 8.7* 8.9   GFR: Estimated Creatinine Clearance: 60.1 mL/min (by C-G formula based on SCr of 0.76 mg/dL). Liver Function Tests: Recent Labs  Lab 05/20/20 1731  AST 30  ALT 17  ALKPHOS 58  BILITOT 0.8  PROT 7.2  ALBUMIN 4.3   No results for input(s): LIPASE, AMYLASE in the last 168 hours. No results for input(s): AMMONIA in the last 168 hours. Coagulation Profile: Recent Labs  Lab 05/20/20 1731  INR 1.1   Cardiac Enzymes: No results for input(s): CKTOTAL, CKMB, CKMBINDEX, TROPONINI in the last 168 hours. BNP (last 3 results) No results for input(s): PROBNP in the last 8760 hours. HbA1C: No results for input(s): HGBA1C in the last 72 hours. CBG: No results for input(s): GLUCAP in the last 168 hours. Lipid Profile: No results for input(s): CHOL, HDL, LDLCALC, TRIG, CHOLHDL, LDLDIRECT in the last 72 hours. Thyroid Function Tests: No results for input(s): TSH, T4TOTAL, FREET4, T3FREE, THYROIDAB in the last 72 hours. Anemia Panel: No results for input(s): VITAMINB12, FOLATE, FERRITIN, TIBC, IRON, RETICCTPCT in the last 72 hours. Sepsis Labs: No results for input(s): PROCALCITON, LATICACIDVEN in the last 168 hours.  Recent Results (from the past 240 hour(s))  Resp Panel by RT-PCR (Flu A&B, Covid) Nasopharyngeal Swab     Status: None   Collection Time: 05/20/20  5:31 PM   Specimen: Nasopharyngeal Swab; Nasopharyngeal(NP) swabs in vial transport medium  Result Value Ref Range Status   SARS Coronavirus 2 by RT PCR NEGATIVE NEGATIVE Final    Comment: (NOTE) SARS-CoV-2 target nucleic acids are NOT DETECTED.  The SARS-CoV-2 RNA is generally  detectable in upper respiratory specimens during the acute phase of infection. The lowest concentration of SARS-CoV-2 viral copies this assay can detect is 138 copies/mL. A negative result does not preclude SARS-Cov-2 infection and should not be used as the sole basis for treatment or other patient management decisions. A negative result may occur with  improper specimen collection/handling, submission of specimen other than nasopharyngeal swab, presence of viral mutation(s) within the areas targeted by this assay, and inadequate number of viral copies(<138 copies/mL). A negative result must be combined with clinical observations, patient history, and epidemiological information. The expected result is Negative.  Fact Sheet for Patients:  BloggerCourse.com  Fact Sheet for Healthcare Providers:  SeriousBroker.it  This test is no t yet approved or cleared by the [place] FDA and  has been authorized for detection and/or diagnosis of SARS-CoV-2 by FDA under an Emergency Use Authorization (EUA). This EUA will remain  in effect (meaning  this test can be used) for the duration of the COVID-19 declaration under Section 564(b)(1) of the Act, 21 U.S.C.section 360bbb-3(b)(1), unless the authorization is terminated  or revoked sooner.       Influenza A by PCR NEGATIVE NEGATIVE Final   Influenza B by PCR NEGATIVE NEGATIVE Final    Comment: (NOTE) The Xpert Xpress SARS-CoV-2/FLU/RSV plus assay is intended as an aid in the diagnosis of influenza from Nasopharyngeal swab specimens and should not be used as a sole basis for treatment. Nasal washings and aspirates are unacceptable for Xpert Xpress SARS-CoV-2/FLU/RSV testing.  Fact Sheet for Patients: BloggerCourse.com  Fact Sheet for Healthcare Providers: SeriousBroker.it  This test is not yet approved or cleared by the [place] FDA  and has been authorized for detection and/or diagnosis of SARS-CoV-2 by FDA under an Emergency Use Authorization (EUA). This EUA will remain in effect (meaning this test can be used) for the duration of the COVID-19 declaration under Section 564(b)(1) of the Act, 21 U.S.C. section 360bbb-3(b)(1), unless the authorization is terminated or revoked.  Performed at [facility], [address] [place], [place] [address]          Radiology Studies: DG Chest Portable 1 View  Result Date: 05/20/2020 CLINICAL DATA:  Hip fracture, preoperative assessment, tripped and fell while pressure washing a house EXAM: PORTABLE CHEST 1 VIEW COMPARISON:  Portable exam 1855 hours without priors for comparison FINDINGS: Normal heart size, mediastinal contours, and pulmonary vascularity. Atherosclerotic calcification aorta. Minimal peribronchial thickening. No infiltrate, pleural effusion, or pneumothorax. Bones demineralized with suspect BILATERAL chronic rotator cuff tears. IMPRESSION: No acute abnormalities. Electronically Signed   By: Ulyses Southward M.D.   On: 05/20/2020 19:27   ECHOCARDIOGRAM COMPLETE  Result Date: 05/21/2020    ECHOCARDIOGRAM REPORT   Patient Name:   [facility] Date of Exam: 05/21/2020 Medical Rec #:  [id number]      Height:       67.0 in Accession #:    [id number]     Weight:       178.6 lb Date of Birth:  1926/06/28       BSA:          1.927 m Patient Age:    89 years       BP:           190/63 mmHg Patient Gender: M              HR:           64 bpm. Exam Location:  [facility] Procedure: 2D Echo, Cardiac Doppler and Color Doppler Indications:     R01.2 Abnormal heart sounds  History:         Patient has no prior history of Echocardiogram examinations.                  Risk Factors:Hypertension and Dyslipidemia.  Sonographer:     [place] Small Rodgers-Jones Referring Phys:  [id number] Alford Highland Diagnosing Phys: Harold Hedge MD IMPRESSIONS  1. Left ventricular ejection fraction, by estimation,  is 60 to 65%. The left ventricle has normal function. The left ventricle has no regional wall motion abnormalities. Left ventricular diastolic parameters are consistent with Grade I diastolic dysfunction (impaired relaxation).  2. Right ventricular systolic function is normal. The right ventricular size is mildly enlarged.  3. Left atrial size was mildly dilated.  4. The mitral valve is grossly normal. Trivial mitral valve regurgitation.  5. The aortic valve is calcified. Aortic valve regurgitation  is trivial. Moderate aortic valve stenosis. FINDINGS  Left Ventricle: Left ventricular ejection fraction, by estimation, is 60 to 65%. The left ventricle has normal function. The left ventricle has no regional wall motion abnormalities. The left ventricular internal cavity size was normal in size. There is  borderline left ventricular hypertrophy. Left ventricular diastolic parameters are consistent with Grade I diastolic dysfunction (impaired relaxation). Right Ventricle: The right ventricular size is mildly enlarged. No increase in right ventricular wall thickness. Right ventricular systolic function is normal. Left Atrium: Left atrial size was mildly dilated. Right Atrium: Right atrial size was normal in size. Pericardium: There is no evidence of pericardial effusion. Mitral Valve: The mitral valve is grossly normal. Trivial mitral valve regurgitation. Tricuspid Valve: The tricuspid valve is not well visualized. Tricuspid valve regurgitation is mild. Aortic Valve: The aortic valve is calcified. Aortic valve regurgitation is trivial. Aortic regurgitation PHT measures 289 msec. Moderate aortic stenosis is present. Aortic valve mean gradient measures 29.4 mmHg. Aortic valve peak gradient measures 45.2 mmHg. Aortic valve area, by VTI measures 1.08 cm. Pulmonic Valve: The pulmonic valve was not well visualized. Pulmonic valve regurgitation is trivial. Aorta: The aortic root was not well visualized. IAS/Shunts: The  interatrial septum was not assessed.  LEFT VENTRICLE PLAX 2D LVIDd:         3.79 cm  Diastology LVIDs:         2.62 cm  LV e' medial:    7.40 cm/s LV PW:         0.83 cm  LV E/e' medial:  12.3 LV IVS:        0.98 cm  LV e' lateral:   7.72 cm/s LVOT diam:     2.20 cm  LV E/e' lateral: 11.8 LV SV:         88 LV SV Index:   46 LVOT Area:     3.80 cm  RIGHT VENTRICLE RV Basal diam:  3.02 cm RV S prime:     12.40 cm/s TAPSE (M-mode): 2.0 cm LEFT ATRIUM             Index       RIGHT ATRIUM           Index LA diam:        4.10 cm 2.13 cm/m  RA Area:     11.80 cm LA Vol (A2C):   45.8 ml 23.77 ml/m RA Volume:   28.00 ml  14.53 ml/m LA Vol (A4C):   40.5 ml 21.02 ml/m LA Biplane Vol: 44.9 ml 23.30 ml/m  AORTIC VALVE AV Area (Vmax):    1.23 cm AV Area (Vmean):   1.16 cm AV Area (VTI):     1.08 cm AV Vmax:           336.20 cm/s AV Vmean:          260.000 cm/s AV VTI:            0.813 m AV Peak Grad:      45.2 mmHg AV Mean Grad:      29.4 mmHg LVOT Vmax:         109.00 cm/s LVOT Vmean:        79.300 cm/s LVOT VTI:          0.231 m LVOT/AV VTI ratio: 0.28 AI PHT:            289 msec  AORTA Ao Root diam: 3.40 cm Ao Asc diam:  3.30 cm MITRAL VALVE MV Area (PHT):  3.27 cm     SHUNTS MV Decel Time: 232 msec     Systemic VTI:  0.23 m MV E velocity: 91.30 cm/s   Systemic Diam: 2.20 cm MV A velocity: 131.00 cm/s MV E/A ratio:  0.70 Harold HedgeKenneth Fath MD Electronically signed by Harold HedgeKenneth Fath MD Signature Date/Time: 05/21/2020/8:30:58 PM    Final    DG Hip Unilat W or Wo Pelvis 2-3 Views Left  Result Date: 05/20/2020 CLINICAL DATA:  Hip fracture EXAM: DG HIP (WITH OR WITHOUT PELVIS) 2-3V LEFT COMPARISON:  09/13/2018 FINDINGS: Osseous demineralization. Multilevel degenerative disc and facet disease changes of visualized lower lumbar spine. BILATERAL hip prostheses. Displaced fracture of the proximal LEFT femur involving the greater trochanter and extending into the intertrochanteric region. A large area of lucency is seen surrounding  the proximal portion of the femoral component, with asymmetric location of the femoral head of the prosthesis within the acetabular cup suggesting liner wear, question particle disease accounting for lucency. Proximal LEFT femoral diaphysis and lesser trochanter appear intact. No dislocation identified. Visualized pelvis appears intact. Surgical clips in pelvis bilaterally. IMPRESSION: Osseous demineralization with BILATERAL hip prostheses. Fracture identified at proximal LEFT femur involving the intertrochanteric region and greater trochanter, minimally displaced. Underlying lucency at the proximal LEFT femur adjacent to the prosthesis may potentially represent sequela of particle disease as above. Electronically Signed   By: Ulyses SouthwardMark  Boles M.D.   On: 05/20/2020 19:31        Scheduled Meds: . amLODipine  5 mg Oral Daily  . atorvastatin  20 mg Oral Daily  . cholecalciferol  1,000 Units Oral Daily  . docusate sodium  100 mg Oral BID  . enoxaparin (LOVENOX) injection  40 mg Subcutaneous Q24H  . feeding supplement  237 mL Oral BID BM  . gabapentin  200 mg Oral TID  . levothyroxine  50 mcg Oral QAC breakfast  . multivitamin with minerals  1 tablet Oral Daily  . senna  1 tablet Oral BID   Continuous Infusions:   LOS: 2 days    Time spent: 25 minutes    Tresa MooreSudheer B Sreenath, MD [facility] Pager [phone]  If 7PM-7AM, please contact night-coverage 05/22/2020, 1:39 PM

## 2020-05-22 NOTE — Evaluation (Signed)
Occupational Therapy Evaluation Patient Details Name: Theodore Graves MRN: [id number] DOB: December 22, 1926 Today's Date: 05/22/2020    History of Present Illness 87 y.o. male with medical history significant of hypertension, hyperlipidemia  , hypothyroidism.  Pt had a fall while pressure washing and suffered L hip fx (h/o b/l hip replacements decades ago).  Ortho has recommended non-surgical management.   Clinical Impression   Pt seen for OT evaluation this date. Wife initially present but leaves at start of session. Pt was independent in all ADL and mobility prior to recent fall and non-operative L hip fracture. Pt is very eager to return to PLOF with less pain and improved safety and independence. Pt currently requires CGA for supine to sit EOB with increased time/effort/heavy BUE reliance and bed rails to perform. MIN A +2 for STS and SPT from EOB to [facility]. MAX cues for hand placement, feet placement, RW mgt for safety. Pt requires at least MOD A for LB ADL tasks 2/2 L hip pain. Pt instructed in bed mobility, ADL transfer training, RW mgt, falls prevention. Pt left seated on BSC for attempt to have BM with CNA present and assisting with sponge bath and preparing new linens. Called PT who will come see pt momentarily once done to assist off BSC. Pt would benefit from additional instruction in self care skills and techniques to help maintain precautions with or without assistive devices to support recall and carryover prior to discharge. Given significant increase in assist required for ADL and mobility as well as significant pain limiting balance, safety, recommend SNF at discharge for short period of time to support eventual return home with spouse.    Follow Up Recommendations  SNF    Equipment Recommendations  3 in 1 bedside commode    Recommendations for Other Services       Precautions / Restrictions Precautions Precautions: Fall Restrictions Weight Bearing Restrictions: Yes LLE Weight  Bearing: Weight bearing as tolerated      Mobility Bed Mobility Overal bed mobility: Needs Assistance Bed Mobility: Supine to Sit   Sidelying to sit: Min assist;HOB elevated;Mod assist       General bed mobility comments: slow, pain limited, with encouragement about to perform with heavy BUE support    Transfers Overall transfer level: Needs assistance Equipment used: Rolling walker (2 wheeled) Transfers: Sit to/from [employer] Sit to Stand: Min assist;+2 physical assistance;From elevated surface Stand pivot transfers: Min assist;+2 physical assistance;From elevated surface       General transfer comment: bed elevated significantly, MAX cues for hand placement/foot placement/RW mgt    Balance Overall balance assessment: Needs assistance Sitting-balance support: Bilateral upper extremity supported Sitting balance-Leahy Scale: Good     Standing balance support: Bilateral upper extremity supported Standing balance-Leahy Scale: Poor Standing balance comment: heavy BUE reliance on RW                           ADL either performed or assessed with clinical judgement   ADL Overall ADL's : Needs assistance/impaired                                       General ADL Comments: Pt currently requires MOD A to MAX A for LB bathing and dressing, MIN A +2 for SPT to BSC; MIN A x2 for STS ADL transfers     Vision Patient Visual Report:  No change from baseline       Perception     Praxis      Pertinent Vitals/Pain Pain Assessment: 0-10 Pain Score: 7  Pain Location: Lhip/groin Pain Descriptors / Indicators: Aching Pain Intervention(s): Monitored during session;Limited activity within patient's tolerance;Premedicated before session;Repositioned     Hand Dominance Right   Extremity/Trunk Assessment Upper Extremity Assessment Upper Extremity Assessment: Overall WFL for tasks assessed   Lower Extremity Assessment Lower  Extremity Assessment: Generalized weakness;LLE deficits/detail LLE Deficits / Details: hip fx, pain limited LLE: Unable to fully assess due to pain       Communication Communication Communication: HOH   Cognition Arousal/Alertness: Awake/alert Behavior During Therapy: WFL for tasks assessed/performed Overall Cognitive Status: Within Functional Limits for tasks assessed                                 General Comments: fearful of pain   General Comments       Exercises Other Exercises Other Exercises: Pt instructed in bed mobility, ADL transfer training, RW mgt, falls prevention   Shoulder Instructions      Home Living Family/patient expects to be discharged to:: Unsure Living Arrangements: Spouse/significant other Available Help at Discharge: Available 24 hours/day                                    Prior Functioning/Environment Level of Independence: Independent        Comments: Pt independent at baseline, no other falls outside of pressure washing patio and falling        OT Problem List: Decreased strength;Pain;Decreased range of motion;Decreased activity tolerance;Impaired balance (sitting and/or standing);Decreased knowledge of use of DME or AE      OT Treatment/Interventions: Self-care/ADL training;Therapeutic exercise;Therapeutic activities;DME and/or AE instruction;Patient/family education;Balance training    OT Goals(Current goals can be found in the care plan section) Acute Rehab OT Goals Patient Stated Goal: Pt would rather not go to rehab and try to manage at home OT Goal Formulation: With patient Time For Goal Achievement: 06/05/20 Potential to Achieve Goals: Fair ADL Goals Pt Will Perform Lower Body Dressing: with mod assist;sit to/from stand Pt Will Transfer to Toilet: with min guard assist;bedside commode;stand pivot transfer (LRAD for amb) Additional ADL Goal #1: Pt will verbalize plan to implement at least 1 learned  fall sprevention strategy. Additional ADL Goal #2: Pt will perform bed mobility with supervision assist  OT Frequency: Min 2X/week   Barriers to D/C: Inaccessible home environment          Co-evaluation              AM-PAC OT "6 Clicks" Daily Activity     Outcome Measure Help from another person eating meals?: None Help from another person taking care of personal grooming?: None Help from another person toileting, which includes using toliet, bedpan, or urinal?: A Lot Help from another person bathing (including washing, rinsing, drying)?: A Lot Help from another person to put on and taking off regular upper body clothing?: A Little Help from another person to put on and taking off regular lower body clothing?: A Lot 6 Click Score: 17   End of Session Equipment Utilized During Treatment: Gait belt;Rolling walker  Activity Tolerance: Patient limited by pain Patient left: Other (comment) (seated on BSC with CNA for BM and sponge bath, PT coming soon)  OT Visit Diagnosis: Other abnormalities of gait and mobility (R26.89);Muscle weakness (generalized) (M62.81);History of falling (Z91.81);Pain Pain - Right/Left: Left Pain - part of body: Hip                Time: 0932-1010 OT Time Calculation (min): 38 min Charges:  OT General Charges $OT Visit: 1 Visit OT Evaluation $OT Eval Moderate Complexity: 1 Mod OT Treatments $Self Care/Home Management : 23-37 mins  Wynona Canes, MPH, MS, OTR/L ascom [phone] 05/22/20, 11:10 AM

## 2020-05-23 DIAGNOSIS — R001 Bradycardia, unspecified: Secondary | ICD-10-CM

## 2020-05-23 NOTE — Progress Notes (Signed)
Patient Name: Theodore FindersCharles L Graves Date of Encounter: 05/23/2020  Hospital Problem List     Active Problems:   Hip fracture [facility](HCC)   Hypothyroidism (acquired)   Hyperlipidemia   Closed fracture of left femur (HCC)   Nausea   Bradycardia   Sinus pause   Weakness    Patient Profile      87 y.o.malewith history ofhyperlipidemia, hypertension and hypothyroidism admitted with a hip fx after a mechanical fall. Was noted to have a periprosthetic left hip fx on xray. EKG on admission nsr at 66 with first degree av block. Since admission has been noted to be in sinus bradycardia with several pauses last pm. He is asymptomatic from these.He denies syncope or presyncope at home. He denies any lightheadedness or dizziness. Review of telemetry shows sinus bradycardia with blocked PACs no prolonged pauses.   Subjective   No pauses.  Asymptomatic from a cardiac standpoint.  Inpatient Medications    . amLODipine  5 mg Oral Daily  . atorvastatin  20 mg Oral Daily  . cholecalciferol  1,000 Units Oral Daily  . docusate sodium  100 mg Oral BID  . enoxaparin (LOVENOX) injection  40 mg Subcutaneous Q24H  . feeding supplement  237 mL Oral BID BM  . gabapentin  200 mg Oral TID  . levothyroxine  50 mcg Oral QAC breakfast  . multivitamin with minerals  1 tablet Oral Daily  . senna  1 tablet Oral BID    Vital Signs    Vitals:   05/23/20 0405 05/23/20 0843 05/23/20 1157 05/23/20 1556  BP: (!) 116/53 (!) 148/60 (!) 126/56 121/82  Pulse: (!) 57 71 62 68  Resp: 18 19 18 19   Temp: 98.6 F (37 C) 98.9 F (37.2 C) 98.6 F (37 C) 98.8 F (37.1 C)  TempSrc:      SpO2: 95% 96% 95% 99%  Weight:      Height:        Intake/Output Summary (Last 24 hours) at 05/23/2020 1636 Last data filed at 05/23/2020 1205 Gross per 24 hour  Intake -  Output 500 ml  Net -500 ml   Filed Weights   05/20/20 1723  Weight: 81 kg    Physical Exam    GEN: Well nourished, well developed, in no acute  distress.  HEENT: normal.  Neck: Supple, no JVD, carotid bruits, or masses. Cardiac: bradycardic  Respiratory:  Respirations regular and unlabored, clear to auscultation bilaterally. GI: Soft, nontender, nondistended, BS + x 4. MS: no deformity or atrophy. Skin: warm and dry, no rash. Neuro:  Strength and sensation are intact.   Labs    CBC Recent Labs    05/20/20 1731 05/21/20 0559 05/22/20 0413  WBC 8.6 8.3 9.6  NEUTROABS 5.9  --   --   HGB 13.4 13.1 13.5  HCT 39.6 38.4* 40.8  MCV 88.0 88.5 89.9  PLT 307 269 293   Basic Metabolic Panel Recent Labs    53/66/4403/22/22 0559 05/22/20 0413  NA 136 135  K 4.4 4.0  CL 105 101  CO2 25 25  GLUCOSE 116* 99  BUN 25* 18  CREATININE 0.93 0.76  CALCIUM 8.7* 8.9   Liver Function Tests Recent Labs    05/20/20 1731  AST 30  ALT 17  ALKPHOS 58  BILITOT 0.8  PROT 7.2  ALBUMIN 4.3   No results for input(s): LIPASE, AMYLASE in the last 72 hours. Cardiac Enzymes No results for input(s): CKTOTAL, CKMB, CKMBINDEX, TROPONINI in the  last 72 hours. BNP No results for input(s): BNP in the last 72 hours. D-Dimer No results for input(s): DDIMER in the last 72 hours. Hemoglobin A1C No results for input(s): HGBA1C in the last 72 hours. Fasting Lipid Panel No results for input(s): CHOL, HDL, LDLCALC, TRIG, CHOLHDL, LDLDIRECT in the last 72 hours. Thyroid Function Tests No results for input(s): TSH, T4TOTAL, T3FREE, THYROIDAB in the last 72 hours.  Invalid input(s): FREET3  Telemetry    Sinus bradycardia with no prolonged pauses.   ECG    Sinus bradycardia  Radiology    DG Chest Portable 1 View  Result Date: 05/20/2020 CLINICAL DATA:  Hip fracture, preoperative assessment, tripped and fell while pressure washing a house EXAM: PORTABLE CHEST 1 VIEW COMPARISON:  Portable exam 1855 hours without priors for comparison FINDINGS: Normal heart size, mediastinal contours, and pulmonary vascularity. Atherosclerotic calcification aorta.  Minimal peribronchial thickening. No infiltrate, pleural effusion, or pneumothorax. Bones demineralized with suspect BILATERAL chronic rotator cuff tears. IMPRESSION: No acute abnormalities. Electronically Signed   By: Ulyses Southward M.D.   On: 05/20/2020 19:27   ECHOCARDIOGRAM COMPLETE  Result Date: 05/21/2020    ECHOCARDIOGRAM REPORT   Patient Name:   Theodore Graves [facility] Date of Exam: 05/21/2020 Medical Rec #:  [id number]      Height:       67.0 in Accession #:    [id number]     Weight:       178.6 lb Date of Birth:  [birth year]       BSA:          1.927 m Patient Age:    87 years       BP:           190/63 mmHg Patient Gender: M              HR:           64 bpm. Exam Location:  [facility] Procedure: 2D Echo, Cardiac Doppler and Color Doppler Indications:     R01.2 Abnormal heart sounds  History:         Patient has no prior history of Echocardiogram examinations.                  Risk Factors:Hypertension and Dyslipidemia.  Sonographer:     Sedonia Small Rodgers-Jones Referring Phys:  [id number] Alford Highland Diagnosing Phys: Harold Hedge MD IMPRESSIONS  1. Left ventricular ejection fraction, by estimation, is 60 to 65%. The left ventricle has normal function. The left ventricle has no regional wall motion abnormalities. Left ventricular diastolic parameters are consistent with Grade I diastolic dysfunction (impaired relaxation).  2. Right ventricular systolic function is normal. The right ventricular size is mildly enlarged.  3. Left atrial size was mildly dilated.  4. The mitral valve is grossly normal. Trivial mitral valve regurgitation.  5. The aortic valve is calcified. Aortic valve regurgitation is trivial. Moderate aortic valve stenosis. FINDINGS  Left Ventricle: Left ventricular ejection fraction, by estimation, is 60 to 65%. The left ventricle has normal function. The left ventricle has no regional wall motion abnormalities. The left ventricular internal cavity size was normal in size. There is  borderline left ventricular  hypertrophy. Left ventricular diastolic parameters are consistent with Grade I diastolic dysfunction (impaired relaxation). Right Ventricle: The right ventricular size is mildly enlarged. No increase in right ventricular wall thickness. Right ventricular systolic function is normal. Left Atrium: Left atrial size was mildly dilated. Right Atrium: Right atrial size was normal in size. Pericardium:  There is no evidence of pericardial effusion. Mitral Valve: The mitral valve is grossly normal. Trivial mitral valve regurgitation. Tricuspid Valve: The tricuspid valve is not well visualized. Tricuspid valve regurgitation is mild. Aortic Valve: The aortic valve is calcified. Aortic valve regurgitation is trivial. Aortic regurgitation PHT measures 289 msec. Moderate aortic stenosis is present. Aortic valve mean gradient measures 29.4 mmHg. Aortic valve peak gradient measures 45.2 mmHg. Aortic valve area, by VTI measures 1.08 cm. Pulmonic Valve: The pulmonic valve was not well visualized. Pulmonic valve regurgitation is trivial. Aorta: The aortic root was not well visualized. IAS/Shunts: The interatrial septum was not assessed.  LEFT VENTRICLE PLAX 2D LVIDd:         3.79 cm  Diastology LVIDs:         2.62 cm  LV e' medial:    7.40 cm/s LV PW:         0.83 cm  LV E/e' medial:  12.3 LV IVS:        0.98 cm  LV e' lateral:   7.72 cm/s LVOT diam:     2.20 cm  LV E/e' lateral: 11.8 LV SV:         88 LV SV Index:   46 LVOT Area:     3.80 cm  RIGHT VENTRICLE RV Basal diam:  3.02 cm RV S prime:     12.40 cm/s TAPSE (M-mode): 2.0 cm LEFT ATRIUM             Index       RIGHT ATRIUM           Index LA diam:        4.10 cm 2.13 cm/m  RA Area:     11.80 cm LA Vol (A2C):   45.8 ml 23.77 ml/m RA Volume:   28.00 ml  14.53 ml/m LA Vol (A4C):   40.5 ml 21.02 ml/m LA Biplane Vol: 44.9 ml 23.30 ml/m  AORTIC VALVE AV Area (Vmax):    1.23 cm AV Area (Vmean):   1.16 cm AV Area (VTI):     1.08 cm AV Vmax:           336.20 cm/s AV Vmean:           260.000 cm/s AV VTI:            0.813 m AV Peak Grad:      45.2 mmHg AV Mean Grad:      29.4 mmHg LVOT Vmax:         109.00 cm/s LVOT Vmean:        79.300 cm/s LVOT VTI:          0.231 m LVOT/AV VTI ratio: 0.28 AI PHT:            289 msec  AORTA Ao Root diam: 3.40 cm Ao Asc diam:  3.30 cm MITRAL VALVE MV Area (PHT): 3.27 cm     SHUNTS MV Decel Time: 232 msec     Systemic VTI:  0.23 m MV E velocity: 91.30 cm/s   Systemic Diam: 2.20 cm MV A velocity: 131.00 cm/s MV E/A ratio:  0.70 Harold Hedge MD Electronically signed by Harold Hedge MD Signature Date/Time: 05/21/2020/8:30:58 PM    Final    DG Hip Unilat W or Wo Pelvis 2-3 Views Left  Result Date: 05/20/2020 CLINICAL DATA:  Hip fracture EXAM: DG HIP (WITH OR WITHOUT PELVIS) 2-3V LEFT COMPARISON:  09/13/2018 FINDINGS: Osseous demineralization. Multilevel degenerative disc and facet disease changes of visualized  lower lumbar spine. BILATERAL hip prostheses. Displaced fracture of the proximal LEFT femur involving the greater trochanter and extending into the intertrochanteric region. A large area of lucency is seen surrounding the proximal portion of the femoral component, with asymmetric location of the femoral head of the prosthesis within the acetabular cup suggesting liner wear, question particle disease accounting for lucency. Proximal LEFT femoral diaphysis and lesser trochanter appear intact. No dislocation identified. Visualized pelvis appears intact. Surgical clips in pelvis bilaterally. IMPRESSION: Osseous demineralization with BILATERAL hip prostheses. Fracture identified at proximal LEFT femur involving the intertrochanteric region and greater trochanter, minimally displaced. Underlying lucency at the proximal LEFT femur adjacent to the prosthesis may potentially represent sequela of particle disease as above. Electronically Signed   By: Ulyses Southward M.D.   On: 05/20/2020 19:31    Assessment & Plan      87 year old male with no apparent  cardiac history being treated for hypertension hypothyroidism and hyperlipidemia who was admitted after a mechanical fall. Noted to have a periprosthetic fracture. Not felt to be surgical candidate. Is noted to have sinus bradycardia as well as occasional dropped PACs. He is asymptomatic from this. He is not currently on any AV nodal medications. Patient denies any syncope at home. We will continue to follow on telemetry and consider Holter monitor after discharge. Based on current data, patient does not appear to be a candidate for a pacemaker however will follow through his hospital stay and as an outpatient.  We will continue to follow.  No cardiac intervention indicated at present.  Signed, Darlin Priestly Fath MD 05/23/2020, 4:36 PM  Pager: [phone]

## 2020-05-23 NOTE — TOC Initial Note (Addendum)
Transition of Care [facility]) - Initial/Assessment Note    Patient Details  Name: Theodore Graves MRN: [id number] Date of Birth: 11-10-1926  Transition of Care [facility]) CM/SW Contact:    Candie Chroman, LCSW Phone Number:  05/23/2020, 11:30 AM  Clinical Narrative:    CSW met with patient and PT. CSW introduced role and explained that PT recommendations would be discussed. Patient is agreeable to SNF placement. [facility] is first preference because he is familiar with the facility. Left message for admissions coordinator to notify. Patient is fully COVID vaccinated and said he has had the booster as well. No further concerns. CSW encouraged patient to contact CSW as needed. CSW will continue to follow patient for support and facilitate discharge to SNF once medically stable.     4:04 pm: [facility] is able to accept patient tomorrow if stable. Patient, wife, and MD are aware. Insurance authorization started.         Expected Discharge Plan: Skilled Nursing Facility Barriers to Discharge: Insurance Authorization,Continued Medical Work up,SNF Pending bed offer   Patient Goals and CMS Choice   CMS Medicare.gov Compare Post Acute Care list provided to:: Patient    Expected Discharge Plan and Services Expected Discharge Plan: [place] Choice: [place] arrangements for the past 2 months: Single Family Home                                      Prior Living Arrangements/Services Living arrangements for the past 2 months: Single Family Home Lives with:: Spouse Patient language and need for interpreter reviewed:: Yes Do you feel safe going back to the place where you live?: Yes      Need for Family Participation in Patient Care: Yes (Comment) Care giver support system in place?: Yes (comment)   Criminal Activity/Legal Involvement Pertinent to Current Situation/Hospitalization: No - Comment as needed  Activities of Daily  Living Home Assistive Devices/Equipment: None ADL Screening (condition at time of admission) Patient's cognitive ability adequate to safely complete daily activities?: Yes Is the patient deaf or have difficulty hearing?: No Does the patient have difficulty seeing, even when wearing glasses/contacts?: No Does the patient have difficulty concentrating, remembering, or making decisions?: No Patient able to express need for assistance with ADLs?: Yes Does the patient have difficulty dressing or bathing?: No Independently performs ADLs?: Yes (appropriate for developmental age) Does the patient have difficulty walking or climbing stairs?: No Weakness of Legs: None Weakness of Arms/Hands: None  Permission Sought/Granted Permission sought to share information with : Facility Art therapist granted to share information with : Yes, Verbal Permission Granted     Permission granted to share info w AGENCY: SNF's        Emotional Assessment Appearance:: Appears stated age Attitude/Demeanor/Rapport: Engaged,Gracious Affect (typically observed): Accepting,Appropriate,Calm,Pleasant Orientation: : Oriented to Self,Oriented to Place,Oriented to  Time,Oriented to Situation Alcohol / Substance Use: Not Applicable Psych Involvement: No (comment)  Admission diagnosis:  Hip fracture ([place]) [S72.009A] Closed fracture of hip, unspecified laterality, initial encounter ([place]) [S72.009A] Patient Active Problem List   Diagnosis Date Noted  . Closed fracture of left femur ([place])   . Nausea   . Bradycardia   . Sinus pause   . Weakness   . Hip fracture ([place]) 05/20/2020  . Hypothyroidism (acquired) 05/20/2020  . Hyperlipidemia 05/20/2020  . DDD (degenerative disc disease),  lumbar 08/27/2015  . Facet syndrome, lumbar 08/27/2015  . Sacroiliac joint dysfunction 08/27/2015   PCP:  Kirk Ruths, MD Pharmacy:   [id number] [place], [id number]: [phone]: [phone]    Social Determinants of Health (SDOH) Interventions    Readmission Risk Interventions No flowsheet data found.

## 2020-05-23 NOTE — Progress Notes (Addendum)
Physical Therapy Treatment Patient Details Name: Theodore Graves MRN: [id number] DOB: 08/02/1926 Today's Date: 05/23/2020    History of Present Illness 87 y.o. male with medical history significant of hypertension, hyperlipidemia  , hypothyroidism.  Pt had a fall while pressure washing and suffered L hip fx (h/o b/l hip replacements decades ago).  Ortho has recommended non-surgical management.    PT Comments    Pt was supine in bed with HOB elevated ~ 20 degrees. He agrees to PT session and is cooperative. Did discuss need for rehab at DC. CM came in during session and pt is agreeable to SNF. " I thought I'd be better by now." He required a lot of increased time and encouragement. Pain limiting progression throughout. He was able to exit bed with max assist of one + increased time. Stood from elevated bed height with +1 assist however requires +2 assist to stand from lower recliner surface and BSC height. Overall pt is progressing towards all goals but will greatly benefit from SNF to assist pt to PLOF.   Follow Up Recommendations  SNF     Equipment Recommendations  None recommended by PT       Precautions / Restrictions Precautions Precautions: Fall Restrictions Weight Bearing Restrictions: Yes LLE Weight Bearing: Weight bearing as tolerated    Mobility  Bed Mobility Overal bed mobility: Needs Assistance Bed Mobility: Supine to Sit;Sit to Supine     Supine to sit: Max assist Sit to supine: Mod assist   General bed mobility comments: Pt requires extensive time with all movements due to fear of pain    Transfers Overall transfer level: Needs assistance Equipment used: Rolling walker (2 wheeled) Transfers: Sit to/from Stand Sit to Stand: From elevated surface;Max assist;+2 safety/equipment Stand pivot transfers: Mod assist;+2 safety/equipment;+2 physical assistance       General transfer comment: pt was able to stand EOB 1 x with max assist. 1 x from recliner with +2  assist, and 1 x from [facility].  Ambulation/Gait Ambulation/Gait assistance: Min assist Gait Distance (Feet): 5 Feet Assistive device: Rolling walker (2 wheeled) Gait Pattern/deviations: Step-to pattern;Antalgic;Trunk flexed Gait velocity: decreased   General Gait Details: Once in standing, pt is able to take steps without difficulty. continues to be limited by pain       Balance Overall balance assessment: Needs assistance Sitting-balance support: Bilateral upper extremity supported Sitting balance-Leahy Scale: Good     Standing balance support: Bilateral upper extremity supported Standing balance-Leahy Scale: Poor Standing balance comment: heavy BUE reliance on RW         Cognition Arousal/Alertness: Awake/alert Behavior During Therapy: WFL for tasks assessed/performed Overall Cognitive Status: Within Functional Limits for tasks assessed        General Comments: fearful of pain             Pertinent Vitals/Pain Pain Assessment: 0-10 Pain Score: 7  Pain Location: Lhip/groin Pain Intervention(s): Limited activity within patient's tolerance;Monitored during session;Premedicated before session;Repositioned           PT Goals (current goals can now be found in the care plan section) Acute Rehab PT Goals Patient Stated Goal: go home (Did agree to rehab) Progress towards PT goals: Progressing toward goals    Frequency    7X/week      PT Plan Current plan remains appropriate       AM-PAC PT "6 Clicks" Mobility   Outcome Measure  Help needed turning from your back to your side while in a flat bed without  using bedrails?: A Lot Help needed moving from lying on your back to sitting on the side of a flat bed without using bedrails?: A Lot Help needed moving to and from a bed to a chair (including a wheelchair)?: A Lot Help needed standing up from a chair using your arms (e.g., wheelchair or bedside chair)?: A Lot Help needed to walk in hospital room?: A  Lot Help needed climbing 3-5 steps with a railing? : A Lot 6 Click Score: 12    End of Session Equipment Utilized During Treatment: Gait belt Activity Tolerance: Patient limited by pain Patient left: in bed;with call bell/phone within reach;with bed alarm set;with nursing/sitter in room Nurse Communication: Mobility status PT Visit Diagnosis: Muscle weakness (generalized) (M62.81);Difficulty in walking, not elsewhere classified (R26.2)     Time: 3419-3790 PT Time Calculation (min) (ACUTE ONLY): 30 min  Charges:  $Therapeutic Activity: 23-37 mins                     Jetta Lout PTA 05/23/20, 1:01 PM

## 2020-05-23 NOTE — NC FL2 (Signed)
[place] MEDICAID FL2 LEVEL OF CARE SCREENING TOOL     IDENTIFICATION  Patient Name: Theodore Graves Birthdate: [birth year] Sex: male Admission Date (Current Location): 05/20/2020  [facility] [place] and [place] Number:  Chiropodist and Address:  [facility], [address], [place], [place] [address]      Provider Number: [id number]  Attending Physician Name and Address:  Tresa Moore, MD  Relative Name and Phone Number:       Current Level of Care: Hospital Recommended Level of Care: Skilled Nursing Facility Prior Approval Number:    Date Approved/Denied:   PASRR Number: [id number]  Discharge Plan: SNF    Current Diagnoses: Patient Active Problem List   Diagnosis Date Noted  . Closed fracture of left femur (HCC)   . Nausea   . Bradycardia   . Sinus pause   . Weakness   . Hip fracture (HCC) 05/20/2020  . Hypothyroidism (acquired) 05/20/2020  . Hyperlipidemia 05/20/2020  . DDD (degenerative disc disease), lumbar 08/27/2015  . Facet syndrome, lumbar 08/27/2015  . Sacroiliac joint dysfunction 08/27/2015    Orientation RESPIRATION BLADDER Height & Weight     Self,Time,Situation,Place  Normal Continent Weight: 178 lb 9.2 oz (81 kg) Height:  5\' 7"  (170.2 cm)  BEHAVIORAL SYMPTOMS/MOOD NEUROLOGICAL BOWEL NUTRITION STATUS   (None)  (None) Continent Diet (Regular)  AMBULATORY STATUS COMMUNICATION OF NEEDS Skin   Extensive Assist Verbally Skin abrasions,Bruising                       Personal Care Assistance Level of Assistance  Bathing,Feeding,Dressing Bathing Assistance: Maximum assistance Feeding assistance: Limited assistance Dressing Assistance: Maximum assistance     Functional Limitations Info  Sight,Hearing,Speech Sight Info: Adequate Hearing Info: Adequate Speech Info: Adequate    SPECIAL CARE FACTORS FREQUENCY  PT (By licensed PT),OT (By licensed OT)     PT Frequency: 5 x week OT Frequency: 5 x week             Contractures Contractures Info: Not present    Additional Factors Info  Code Status,Allergies Code Status Info: DNR Allergies Info: Morphine and related           Current Medications (05/23/2020):  This is the current hospital active medication list Current Facility-Administered Medications  Medication Dose Route Frequency Provider Last Rate Last Admin  . acetaminophen (TYLENOL) tablet 650 mg  650 mg Oral Q6H PRN 05/25/2020, MD   650 mg at 05/22/20 2210  . amLODipine (NORVASC) tablet 5 mg  5 mg Oral Daily 05/24/20 B, MD   5 mg at 05/23/20 0933  . atorvastatin (LIPITOR) tablet 20 mg  20 mg Oral Daily Doutova, Anastassia, MD   20 mg at 05/23/20 0933  . cholecalciferol (VITAMIN D3) tablet 1,000 Units  1,000 Units Oral Daily 05/25/20, MD   1,000 Units at 05/23/20 0934  . docusate sodium (COLACE) capsule 100 mg  100 mg Oral BID 05/25/20, MD   100 mg at 05/23/20 0932  . enoxaparin (LOVENOX) injection 40 mg  40 mg Subcutaneous Q24H 05/25/20, RPH   40 mg at 05/23/20 05/25/20  . feeding supplement (ENSURE ENLIVE / ENSURE PLUS) liquid 237 mL  237 mL Oral BID BM [id number], MD   237 mL at 05/23/20 0937  . gabapentin (NEURONTIN) capsule 200 mg  200 mg Oral TID 05/25/20 B, MD   200 mg at 05/23/20 0932  . levothyroxine (SYNTHROID) tablet 50  mcg  50 mcg Oral QAC breakfast Therisa Doyne, MD   50 mcg at 05/23/20 0552  . multivitamin with minerals tablet 1 tablet  1 tablet Oral Daily Alford Highland, MD   1 tablet at 05/23/20 0933  . ondansetron (ZOFRAN) injection 4 mg  4 mg Intravenous Q6H PRN Jimmye Norman, NP   4 mg at 05/21/20 1943  . polyethylene glycol (MIRALAX / GLYCOLAX) packet 17 g  17 g Oral Daily PRN Doutova, Anastassia, MD      . senna (SENOKOT) tablet 8.6 mg  1 tablet Oral BID Therisa Doyne, MD   8.6 mg at 05/23/20 0932  . traMADol (ULTRAM) tablet 50 mg  50 mg Oral Q6H PRN Alford Highland, MD   50 mg at 05/23/20  [id number]     Discharge Medications: Please see discharge summary for a list of discharge medications.  Relevant Imaging Results:  Relevant Lab Results:   Additional Information SS#: [id number]. Vaccines: Pfizer 05/09/19, 06/06/19. Also stated he has had the booster.  Margarito Liner, LCSW

## 2020-05-23 NOTE — Progress Notes (Signed)
PROGRESS NOTE    Theodore Graves  LAG:[id number] DOB: 1927-01-09 DOA: 05/20/2020 PCP: Lauro Regulus, MD  Brief Narrative:  87 year old male history of hypertension, hyperlipidemia presented after mechanical fall.  No head injury or loss of consciousness.  Found to have left proximal her periprosthetic hip fracture.  Seen by orthopedic surgery who recommended medical management.  Patient has been seen by physical therapy and is gradually improving however has had issues with various narcotics.  Seems to be better controlled.  Also some issues with bradycardia and sinus pauses.  Cardiology consulted with no changes in inpatient management.  Patient is not on any AV nodal blocking agents and is asymptomatic during these episodes.  Cardiology recommending outpatient Holter monitor, however will monitor on telemetry during hospitalization.   Assessment & Plan:   Active Problems:   Hip fracture (HCC)   Hypothyroidism (acquired)   Hyperlipidemia   Closed fracture of left femur (HCC)   Nausea   Bradycardia   Sinus pause   Weakness  Fracture left proximal femur with hip prosthesis.     Orthopedics evaluated and recommending medical management.   Has been progressing slowly with physical therapy.   Pain control is been an issue due to various intolerances.   Seems to be well controlled now.   Continue current plan of care.   Antiemetics as needed.   At this time patient medically stable for discharge to skilled nursing facility [facility] aware, will start bed search  Bradycardia and sinus pauses.   Cardiology consulted, recommendations appreciated.   Patient not on any AV nodal blocking agents and asymptomatic during these episodes.   Echocardiogram reassuring Per cardiology not a candidate for pacemaker placement splint at this time Continue telemetry monitoring Outpatient cardiology evaluation  Hypothyroidism  levothyroxine  Hyperlipidemia  atorvastatin  Weakness  Physical  therapy recommending rehab.   Has been slowly progressing as pain control improves Medically stable for discharge  Essential hypertension Likely elevated due to pain.   Improved control over interval Continue Norvasc 5 mg daily, uptitrate as necessary  Vitamin D deficiency  oral vitamin D    DVT prophylaxis: Lovenox Code Status: DNR Family Communication: Wife Larita Fife [phone] on 3/23 Disposition Plan: Status is: Inpatient  Remains inpatient appropriate because:Unsafe d/c plan   Dispo: The patient is from: Home              Anticipated d/c is to: SNF              Patient currently is medically stable to d/c.   Difficult to place patient No   Pain control improved.  Patient medically stable for discharge at this time.  TOC aware of bed search initiated.    Level of care: Med-Surg  Consultants:   Orthopedics  Cardiology  Procedures:   None  Antimicrobials:   None   Subjective: Seen and examined.  Improved pain control.  No other complaints.  Objective: Vitals:   05/22/20 1530 05/22/20 1924 05/23/20 0405 05/23/20 0843  BP: (!) 143/63 127/66 (!) 116/53 (!) 148/60  Pulse: 83 77 (!) 57 71  Resp: 19 17 18 19   Temp: 98.7 F (37.1 C) 99.8 F (37.7 C) 98.6 F (37 C) 98.9 F (37.2 C)  TempSrc:  Oral    SpO2: 98% 97% 95% 96%  Weight:      Height:        Intake/Output Summary (Last 24 hours) at 05/23/2020 0936 Last data filed at 05/23/2020 0857 Gross per 24 hour  Intake 240 ml  Output 600 ml  Net -360 ml   Filed Weights   05/20/20 1723  Weight: 81 kg    Examination:  General exam: Appears calm and comfortable  Respiratory system: Clear to auscultation. Respiratory effort normal. Cardiovascular system: S1-S2.+ Murmur 3/6 Gastrointestinal system: Abdomen is nondistended, soft and nontender. No organomegaly or masses felt. Normal bowel sounds heard. Central nervous system: Alert and oriented. No focal neurological deficits. Extremities: Symmetric 5  x 5 power. Skin: No rashes, lesions or ulcers Psychiatry: Judgement and insight appear normal. Mood & affect appropriate.     Data Reviewed: I have personally reviewed following labs and imaging studies  CBC: Recent Labs  Lab 05/20/20 1731 05/21/20 0559 05/22/20 0413  WBC 8.6 8.3 9.6  NEUTROABS 5.9  --   --   HGB 13.4 13.1 13.5  HCT 39.6 38.4* 40.8  MCV 88.0 88.5 89.9  PLT 307 269 293   Basic Metabolic Panel: Recent Labs  Lab 05/20/20 1731 05/21/20 0559 05/22/20 0413  NA 135 136 135  K 4.5 4.4 4.0  CL 102 105 101  CO2 24 25 25   GLUCOSE 82 116* 99  BUN 28* 25* 18  CREATININE 0.95 0.93 0.76  CALCIUM 9.1 8.7* 8.9   GFR: Estimated Creatinine Clearance: 60.1 mL/min (by C-G formula based on SCr of 0.76 mg/dL). Liver Function Tests: Recent Labs  Lab 05/20/20 1731  AST 30  ALT 17  ALKPHOS 58  BILITOT 0.8  PROT 7.2  ALBUMIN 4.3   No results for input(s): LIPASE, AMYLASE in the last 168 hours. No results for input(s): AMMONIA in the last 168 hours. Coagulation Profile: Recent Labs  Lab 05/20/20 1731  INR 1.1   Cardiac Enzymes: No results for input(s): CKTOTAL, CKMB, CKMBINDEX, TROPONINI in the last 168 hours. BNP (last 3 results) No results for input(s): PROBNP in the last 8760 hours. HbA1C: No results for input(s): HGBA1C in the last 72 hours. CBG: No results for input(s): GLUCAP in the last 168 hours. Lipid Profile: No results for input(s): CHOL, HDL, LDLCALC, TRIG, CHOLHDL, LDLDIRECT in the last 72 hours. Thyroid Function Tests: No results for input(s): TSH, T4TOTAL, FREET4, T3FREE, THYROIDAB in the last 72 hours. Anemia Panel: No results for input(s): VITAMINB12, FOLATE, FERRITIN, TIBC, IRON, RETICCTPCT in the last 72 hours. Sepsis Labs: No results for input(s): PROCALCITON, LATICACIDVEN in the last 168 hours.  Recent Results (from the past 240 hour(s))  Resp Panel by RT-PCR (Flu A&B, Covid) Nasopharyngeal Swab     Status: None   Collection Time:  05/20/20  5:31 PM   Specimen: Nasopharyngeal Swab; Nasopharyngeal(NP) swabs in vial transport medium  Result Value Ref Range Status   SARS Coronavirus 2 by RT PCR NEGATIVE NEGATIVE Final    Comment: (NOTE) SARS-CoV-2 target nucleic acids are NOT DETECTED.  The SARS-CoV-2 RNA is generally detectable in upper respiratory specimens during the acute phase of infection. The lowest concentration of SARS-CoV-2 viral copies this assay can detect is 138 copies/mL. A negative result does not preclude SARS-Cov-2 infection and should not be used as the sole basis for treatment or other patient management decisions. A negative result may occur with  improper specimen collection/handling, submission of specimen other than nasopharyngeal swab, presence of viral mutation(s) within the areas targeted by this assay, and inadequate number of viral copies(<138 copies/mL). A negative result must be combined with clinical observations, patient history, and epidemiological information. The expected result is Negative.  Fact Sheet for Patients:  05/22/20  Fact  Sheet for Healthcare Providers:  SeriousBroker.it  This test is no t yet approved or cleared by the [place] FDA and  has been authorized for detection and/or diagnosis of SARS-CoV-2 by FDA under an Emergency Use Authorization (EUA). This EUA will remain  in effect (meaning this test can be used) for the duration of the COVID-19 declaration under Section 564(b)(1) of the Act, 21 U.S.C.section 360bbb-3(b)(1), unless the authorization is terminated  or revoked sooner.       Influenza A by PCR NEGATIVE NEGATIVE Final   Influenza B by PCR NEGATIVE NEGATIVE Final    Comment: (NOTE) The Xpert Xpress SARS-CoV-2/FLU/RSV plus assay is intended as an aid in the diagnosis of influenza from Nasopharyngeal swab specimens and should not be used as a sole basis for treatment. Nasal washings  and aspirates are unacceptable for Xpert Xpress SARS-CoV-2/FLU/RSV testing.  Fact Sheet for Patients: BloggerCourse.com  Fact Sheet for Healthcare Providers: SeriousBroker.it  This test is not yet approved or cleared by the [place] FDA and has been authorized for detection and/or diagnosis of SARS-CoV-2 by FDA under an Emergency Use Authorization (EUA). This EUA will remain in effect (meaning this test can be used) for the duration of the COVID-19 declaration under Section 564(b)(1) of the Act, 21 U.S.C. section 360bbb-3(b)(1), unless the authorization is terminated or revoked.  Performed at [facility], [address] [place], [place] [address]          Radiology Studies: ECHOCARDIOGRAM COMPLETE  Result Date: 05/21/2020    ECHOCARDIOGRAM REPORT   Patient Name:   BRAELIN COSTLOW [facility] Date of Exam: 05/21/2020 Medical Rec #:  [id number]      Height:       67.0 in Accession #:    [id number]     Weight:       178.6 lb Date of Birth:  09-02-1926       BSA:          1.927 m Patient Age:    89 years       BP:           190/63 mmHg Patient Gender: M              HR:           64 bpm. Exam Location:  [facility] Procedure: 2D Echo, Cardiac Doppler and Color Doppler Indications:     R01.2 Abnormal heart sounds  History:         Patient has no prior history of Echocardiogram examinations.                  Risk Factors:Hypertension and Dyslipidemia.  Sonographer:     [place] Small Rodgers-Jones Referring Phys:  [id number] Alford Highland Diagnosing Phys: Harold Hedge MD IMPRESSIONS  1. Left ventricular ejection fraction, by estimation, is 60 to 65%. The left ventricle has normal function. The left ventricle has no regional wall motion abnormalities. Left ventricular diastolic parameters are consistent with Grade I diastolic dysfunction (impaired relaxation).  2. Right ventricular systolic function is normal. The right ventricular size is mildly enlarged.   3. Left atrial size was mildly dilated.  4. The mitral valve is grossly normal. Trivial mitral valve regurgitation.  5. The aortic valve is calcified. Aortic valve regurgitation is trivial. Moderate aortic valve stenosis. FINDINGS  Left Ventricle: Left ventricular ejection fraction, by estimation, is 60 to 65%. The left ventricle has normal function. The left ventricle has no regional wall motion abnormalities. The left ventricular internal cavity size  was normal in size. There is  borderline left ventricular hypertrophy. Left ventricular diastolic parameters are consistent with Grade I diastolic dysfunction (impaired relaxation). Right Ventricle: The right ventricular size is mildly enlarged. No increase in right ventricular wall thickness. Right ventricular systolic function is normal. Left Atrium: Left atrial size was mildly dilated. Right Atrium: Right atrial size was normal in size. Pericardium: There is no evidence of pericardial effusion. Mitral Valve: The mitral valve is grossly normal. Trivial mitral valve regurgitation. Tricuspid Valve: The tricuspid valve is not well visualized. Tricuspid valve regurgitation is mild. Aortic Valve: The aortic valve is calcified. Aortic valve regurgitation is trivial. Aortic regurgitation PHT measures 289 msec. Moderate aortic stenosis is present. Aortic valve mean gradient measures 29.4 mmHg. Aortic valve peak gradient measures 45.2 mmHg. Aortic valve area, by VTI measures 1.08 cm. Pulmonic Valve: The pulmonic valve was not well visualized. Pulmonic valve regurgitation is trivial. Aorta: The aortic root was not well visualized. IAS/Shunts: The interatrial septum was not assessed.  LEFT VENTRICLE PLAX 2D LVIDd:         3.79 cm  Diastology LVIDs:         2.62 cm  LV e' medial:    7.40 cm/s LV PW:         0.83 cm  LV E/e' medial:  12.3 LV IVS:        0.98 cm  LV e' lateral:   7.72 cm/s LVOT diam:     2.20 cm  LV E/e' lateral: 11.8 LV SV:         88 LV SV Index:   46 LVOT  Area:     3.80 cm  RIGHT VENTRICLE RV Basal diam:  3.02 cm RV S prime:     12.40 cm/s TAPSE (M-mode): 2.0 cm LEFT ATRIUM             Index       RIGHT ATRIUM           Index LA diam:        4.10 cm 2.13 cm/m  RA Area:     11.80 cm LA Vol (A2C):   45.8 ml 23.77 ml/m RA Volume:   28.00 ml  14.53 ml/m LA Vol (A4C):   40.5 ml 21.02 ml/m LA Biplane Vol: 44.9 ml 23.30 ml/m  AORTIC VALVE AV Area (Vmax):    1.23 cm AV Area (Vmean):   1.16 cm AV Area (VTI):     1.08 cm AV Vmax:           336.20 cm/s AV Vmean:          260.000 cm/s AV VTI:            0.813 m AV Peak Grad:      45.2 mmHg AV Mean Grad:      29.4 mmHg LVOT Vmax:         109.00 cm/s LVOT Vmean:        79.300 cm/s LVOT VTI:          0.231 m LVOT/AV VTI ratio: 0.28 AI PHT:            289 msec  AORTA Ao Root diam: 3.40 cm Ao Asc diam:  3.30 cm MITRAL VALVE MV Area (PHT): 3.27 cm     SHUNTS MV Decel Time: 232 msec     Systemic VTI:  0.23 m MV E velocity: 91.30 cm/s   Systemic Diam: 2.20 cm MV A velocity: 131.00 cm/s MV E/A ratio:  0.70 Harold Hedge MD Electronically signed by Harold Hedge MD Signature Date/Time: 05/21/2020/8:30:58 PM    Final         Scheduled Meds: . amLODipine  5 mg Oral Daily  . atorvastatin  20 mg Oral Daily  . cholecalciferol  1,000 Units Oral Daily  . docusate sodium  100 mg Oral BID  . enoxaparin (LOVENOX) injection  40 mg Subcutaneous Q24H  . feeding supplement  237 mL Oral BID BM  . gabapentin  200 mg Oral TID  . levothyroxine  50 mcg Oral QAC breakfast  . multivitamin with minerals  1 tablet Oral Daily  . senna  1 tablet Oral BID   Continuous Infusions:   LOS: 3 days    Time spent: 15 minutes    Tresa Moore, MD [facility] Pager [phone]  If 7PM-7AM, please contact night-coverage 05/23/2020, 9:36 AM

## 2020-05-23 NOTE — Plan of Care (Signed)

## 2020-05-24 MED ORDER — TRAMADOL HCL 50 MG PO TABS: 50.0000 mg | ORAL_TABLET | Freq: Four times a day (QID) | ORAL | 0 refills | Status: AC | PRN

## 2020-05-24 MED ORDER — ENOXAPARIN SODIUM 40 MG/0.4ML ~~LOC~~ SOLN: 40.0000 mg | SUBCUTANEOUS | Status: AC

## 2020-05-24 MED ORDER — SENNA 8.6 MG PO TABS: 1.0000 | ORAL_TABLET | Freq: Two times a day (BID) | ORAL | 0 refills | Status: AC

## 2020-05-24 MED ORDER — GABAPENTIN 300 MG PO CAPS
300.0000 mg | ORAL_CAPSULE | Freq: Three times a day (TID) | ORAL | Status: DC
  Administered 2020-05-24: 300 mg via ORAL
  Filled 2020-05-24: qty 1

## 2020-05-24 MED ORDER — GABAPENTIN 300 MG PO CAPS
300.0000 mg | ORAL_CAPSULE | Freq: Three times a day (TID) | ORAL | Status: AC
Start: 2020-05-24 — End: ?

## 2020-05-24 MED ORDER — DOCUSATE SODIUM 100 MG PO CAPS: 100.0000 mg | ORAL_CAPSULE | Freq: Two times a day (BID) | ORAL | 0 refills | Status: AC

## 2020-05-24 MED ORDER — AMLODIPINE BESYLATE 5 MG PO TABS: 5.0000 mg | ORAL_TABLET | Freq: Every day | ORAL | Status: AC

## 2020-05-24 MED ORDER — VITAMIN D3 25 MCG PO TABS: 1000.0000 [IU] | ORAL_TABLET | Freq: Every day | ORAL | Status: AC

## 2020-05-24 NOTE — Plan of Care (Signed)
  Problem: Education: Goal: Knowledge of General Education information will improve Description: Including pain rating scale, medication(s)/side effects and non-pharmacologic comfort measures Outcome: Adequate for Discharge   Problem: Health Behavior/Discharge Planning: Goal: Ability to manage health-related needs will improve Outcome: Adequate for Discharge   Problem: Clinical Measurements: Goal: Ability to maintain clinical measurements within normal limits will improve Outcome: Adequate for Discharge Goal: Will remain free from infection Outcome: Adequate for Discharge Goal: Diagnostic test results will improve Outcome: Adequate for Discharge Goal: Respiratory complications will improve Outcome: Adequate for Discharge Goal: Cardiovascular complication will be avoided Outcome: Adequate for Discharge   Problem: Activity: Goal: Risk for activity intolerance will decrease Outcome: Adequate for Discharge   Problem: Nutrition: Goal: Adequate nutrition will be maintained Outcome: Adequate for Discharge   Problem: Coping: Goal: Level of anxiety will decrease Outcome: Adequate for Discharge   Problem: Elimination: Goal: Will not experience complications related to bowel motility Outcome: Adequate for Discharge Goal: Will not experience complications related to urinary retention Outcome: Adequate for Discharge   Problem: Pain Managment: Goal: General experience of comfort will improve Outcome: Adequate for Discharge   Problem: Safety: Goal: Ability to remain free from injury will improve Outcome: Adequate for Discharge   Problem: Skin Integrity: Goal: Risk for impaired skin integrity will decrease Outcome: Adequate for Discharge   Discharging to [facility] for STR

## 2020-05-24 NOTE — Discharge Summary (Signed)
Physician Discharge Summary  Theodore Graves:[id number] DOB: 1926-07-01 DOA: 05/20/2020  PCP: Lauro Regulus, MD  Admit date: 05/20/2020 Discharge date: 05/24/2020  Admitted From:Home Disposition: SNF  Recommendations for Outpatient Follow-up:  1. Follow up with PCP in 1-2 weeks 2. Follow up with cardiology as directed  Home Health:No Equipment/Devices:None Discharge Condition:Stable CODE STATUS:DNR Diet recommendation: Regular Brief/Interim Summary: 87 year old male history of hypertension, hyperlipidemia presented after mechanical fall.  No head injury or loss of consciousness.  Found to have left proximal her periprosthetic hip fracture.  Seen by orthopedic surgery who recommended medical management.  Patient has been seen by physical therapy and is gradually improving however has had issues with various narcotics.  Seems to be better controlled.  Also some issues with bradycardia and sinus pauses.  Cardiology consulted with no changes in inpatient management.  Patient is not on any AV nodal blocking agents and is asymptomatic during these episodes.  Cardiology recommending outpatient Holter monitor, however will monitor on telemetry during hospitalization.  Discharge Diagnoses:  Active Problems:   Hip fracture (HCC)   Hypothyroidism (acquired)   Hyperlipidemia   Closed fracture of left femur (HCC)   Nausea   Bradycardia   Sinus pause   Weakness   Fracture left proximal femur with hip prosthesis.    Orthopedics evaluated and recommending medical management.   Has been progressing slowly with physical therapy.   Pain control is been an issue due to various intolerances.   Seems to be well controlled now.  Discharge to SNF on oral pain regimen Will dispo to [facility] FU OP PCP  Bradycardia and sinus pauses.   Cardiology consulted, recommendations appreciated.   Patient not on any AV nodal blocking agents and asymptomatic during these episodes.    Echocardiogram reassuring Per cardiology not a candidate for pacemaker placement at this time Outpatient cardiology evaluation  Hypothyroidism  levothyroxine  Hyperlipidemia  atorvastatin  Weakness Physical therapy recommending rehab.   Has been slowly progressing as pain control improves Medically stable for discharge  Essential hypertension Likely elevated due to pain.   Improved control over interval Continue Norvasc 5 mg daily, uptitrate as necessary  Vitamin D deficiency  oral vitamin D  Discharge Instructions  Discharge Instructions    Diet - low sodium heart healthy   Complete by: As directed    Increase activity slowly   Complete by: As directed      Allergies as of 05/24/2020      Reactions   Morphine And Related Other (See Comments)   Headache      Medication List    STOP taking these medications   cefUROXime 250 MG tablet Commonly known as: Ceftin     TAKE these medications   amLODipine 5 MG tablet Commonly known as: NORVASC Take 1 tablet (5 mg total) by mouth daily.   atorvastatin 20 MG tablet Commonly known as: LIPITOR Take 20 mg by mouth daily.   docusate sodium 100 MG capsule Commonly known as: COLACE Take 1 capsule (100 mg total) by mouth 2 (two) times daily.   enoxaparin 40 MG/0.4ML injection Commonly known as: LOVENOX Inject 0.4 mLs (40 mg total) into the skin daily for 14 days.   gabapentin 300 MG capsule Commonly known as: NEURONTIN Take 1 capsule (300 mg total) by mouth 3 (three) times daily.   levothyroxine 50 MCG tablet Commonly known as: SYNTHROID Take 50 mcg by mouth daily before breakfast.   senna 8.6 MG Tabs tablet Commonly known as: [employer]  Take 1 tablet (8.6 mg total) by mouth 2 (two) times daily.   traMADol 50 MG tablet Commonly known as: ULTRAM Take 1 tablet (50 mg total) by mouth every 6 (six) hours as needed for up to 3 days for moderate pain. For SNF use only   Vitamin D3 25 MCG tablet Commonly  known as: Vitamin D Take 1 tablet (1,000 Units total) by mouth daily.       Allergies  Allergen Reactions  . Morphine And Related Other (See Comments)    Headache    Consultations:  Cardiology- [facility]   Procedures/Studies: DG Chest Portable 1 View  Result Date: 05/20/2020 CLINICAL DATA:  Hip fracture, preoperative assessment, tripped and fell while pressure washing a house EXAM: PORTABLE CHEST 1 VIEW COMPARISON:  Portable exam 1855 hours without priors for comparison FINDINGS: Normal heart size, mediastinal contours, and pulmonary vascularity. Atherosclerotic calcification aorta. Minimal peribronchial thickening. No infiltrate, pleural effusion, or pneumothorax. Bones demineralized with suspect BILATERAL chronic rotator cuff tears. IMPRESSION: No acute abnormalities. Electronically Signed   By: Ulyses Southward M.D.   On: 05/20/2020 19:27   ECHOCARDIOGRAM COMPLETE  Result Date: 05/21/2020    ECHOCARDIOGRAM REPORT   Patient Name:   Theodore Graves [facility] Date of Exam: 05/21/2020 Medical Rec #:  [id number]      Height:       67.0 in Accession #:    [id number]     Weight:       178.6 lb Date of Birth:  1927/01/22       BSA:          1.927 m Patient Age:    89 years       BP:           190/63 mmHg Patient Gender: M              HR:           64 bpm. Exam Location:  [facility] Procedure: 2D Echo, Cardiac Doppler and Color Doppler Indications:     R01.2 Abnormal heart sounds  History:         Patient has no prior history of Echocardiogram examinations.                  Risk Factors:Hypertension and Dyslipidemia.  Sonographer:     Sedonia Small Rodgers-Jones Referring Phys:  [id number] Alford Highland Diagnosing Phys: Harold Hedge MD IMPRESSIONS  1. Left ventricular ejection fraction, by estimation, is 60 to 65%. The left ventricle has normal function. The left ventricle has no regional wall motion abnormalities. Left ventricular diastolic parameters are consistent with Grade I diastolic dysfunction (impaired relaxation).  2. Right  ventricular systolic function is normal. The right ventricular size is mildly enlarged.  3. Left atrial size was mildly dilated.  4. The mitral valve is grossly normal. Trivial mitral valve regurgitation.  5. The aortic valve is calcified. Aortic valve regurgitation is trivial. Moderate aortic valve stenosis. FINDINGS  Left Ventricle: Left ventricular ejection fraction, by estimation, is 60 to 65%. The left ventricle has normal function. The left ventricle has no regional wall motion abnormalities. The left ventricular internal cavity size was normal in size. There is  borderline left ventricular hypertrophy. Left ventricular diastolic parameters are consistent with Grade I diastolic dysfunction (impaired relaxation). Right Ventricle: The right ventricular size is mildly enlarged. No increase in right ventricular wall thickness. Right ventricular systolic function is normal. Left Atrium: Left atrial size was mildly dilated. Right Atrium: Right atrial size was normal in  size. Pericardium: There is no evidence of pericardial effusion. Mitral Valve: The mitral valve is grossly normal. Trivial mitral valve regurgitation. Tricuspid Valve: The tricuspid valve is not well visualized. Tricuspid valve regurgitation is mild. Aortic Valve: The aortic valve is calcified. Aortic valve regurgitation is trivial. Aortic regurgitation PHT measures 289 msec. Moderate aortic stenosis is present. Aortic valve mean gradient measures 29.4 mmHg. Aortic valve peak gradient measures 45.2 mmHg. Aortic valve area, by VTI measures 1.08 cm. Pulmonic Valve: The pulmonic valve was not well visualized. Pulmonic valve regurgitation is trivial. Aorta: The aortic root was not well visualized. IAS/Shunts: The interatrial septum was not assessed.  LEFT VENTRICLE PLAX 2D LVIDd:         3.79 cm  Diastology LVIDs:         2.62 cm  LV e' medial:    7.40 cm/s LV PW:         0.83 cm  LV E/e' medial:  12.3 LV IVS:        0.98 cm  LV e' lateral:   7.72 cm/s  LVOT diam:     2.20 cm  LV E/e' lateral: 11.8 LV SV:         88 LV SV Index:   46 LVOT Area:     3.80 cm  RIGHT VENTRICLE RV Basal diam:  3.02 cm RV S prime:     12.40 cm/s TAPSE (M-mode): 2.0 cm LEFT ATRIUM             Index       RIGHT ATRIUM           Index LA diam:        4.10 cm 2.13 cm/m  RA Area:     11.80 cm LA Vol (A2C):   45.8 ml 23.77 ml/m RA Volume:   28.00 ml  14.53 ml/m LA Vol (A4C):   40.5 ml 21.02 ml/m LA Biplane Vol: 44.9 ml 23.30 ml/m  AORTIC VALVE AV Area (Vmax):    1.23 cm AV Area (Vmean):   1.16 cm AV Area (VTI):     1.08 cm AV Vmax:           336.20 cm/s AV Vmean:          260.000 cm/s AV VTI:            0.813 m AV Peak Grad:      45.2 mmHg AV Mean Grad:      29.4 mmHg LVOT Vmax:         109.00 cm/s LVOT Vmean:        79.300 cm/s LVOT VTI:          0.231 m LVOT/AV VTI ratio: 0.28 AI PHT:            289 msec  AORTA Ao Root diam: 3.40 cm Ao Asc diam:  3.30 cm MITRAL VALVE MV Area (PHT): 3.27 cm     SHUNTS MV Decel Time: 232 msec     Systemic VTI:  0.23 m MV E velocity: 91.30 cm/s   Systemic Diam: 2.20 cm MV A velocity: 131.00 cm/s MV E/A ratio:  0.70 Harold Hedge MD Electronically signed by Harold Hedge MD Signature Date/Time: 05/21/2020/8:30:58 PM    Final    DG Hip Unilat W or Wo Pelvis 2-3 Views Left  Result Date: 05/20/2020 CLINICAL DATA:  Hip fracture EXAM: DG HIP (WITH OR WITHOUT PELVIS) 2-3V LEFT COMPARISON:  09/13/2018 FINDINGS: Osseous demineralization. Multilevel degenerative disc and facet disease changes  of visualized lower lumbar spine. BILATERAL hip prostheses. Displaced fracture of the proximal LEFT femur involving the greater trochanter and extending into the intertrochanteric region. A large area of lucency is seen surrounding the proximal portion of the femoral component, with asymmetric location of the femoral head of the prosthesis within the acetabular cup suggesting liner wear, question particle disease accounting for lucency. Proximal LEFT femoral diaphysis  and lesser trochanter appear intact. No dislocation identified. Visualized pelvis appears intact. Surgical clips in pelvis bilaterally. IMPRESSION: Osseous demineralization with BILATERAL hip prostheses. Fracture identified at proximal LEFT femur involving the intertrochanteric region and greater trochanter, minimally displaced. Underlying lucency at the proximal LEFT femur adjacent to the prosthesis may potentially represent sequela of particle disease as above. Electronically Signed   By: Ulyses Southward M.D.   On: 05/20/2020 19:31    (Echo, Carotid, EGD, Colonoscopy, ERCP)    Subjective: Seen and examined on the day of discharge.  No complaints, medically stable for dc to SNF  Discharge Exam: Vitals:   05/24/20 0404 05/24/20 0728  BP: (!) 130/54 (!) 137/55  Pulse: (!) 56 61  Resp: 18 15  Temp: 98.1 F (36.7 C) 98.2 F (36.8 C)  SpO2: 97% 97%   Vitals:   05/23/20 1556 05/23/20 2015 05/24/20 0404 05/24/20 0728  BP: 121/82 136/63 (!) 130/54 (!) 137/55  Pulse: 68 66 (!) 56 61  Resp: 19 18 18 15   Temp: 98.8 F (37.1 C) 99 F (37.2 C) 98.1 F (36.7 C) 98.2 F (36.8 C)  TempSrc:      SpO2: 99% 97% 97% 97%  Weight:      Height:        General: Pt is alert, awake, not in acute distress Cardiovascular: RRR, S1/S2 +, no rubs, no gallops Respiratory: CTA bilaterally, no wheezing, no rhonchi Abdominal: Soft, NT, ND, bowel sounds + Extremities: no edema, no cyanosis    The results of significant diagnostics from this hospitalization (including imaging, microbiology, ancillary and laboratory) are listed below for reference.     Microbiology: Recent Results (from the past 240 hour(s))  Resp Panel by RT-PCR (Flu A&B, Covid) Nasopharyngeal Swab     Status: None   Collection Time: 05/20/20  5:31 PM   Specimen: Nasopharyngeal Swab; Nasopharyngeal(NP) swabs in vial transport medium  Result Value Ref Range Status   SARS Coronavirus 2 by RT PCR NEGATIVE NEGATIVE Final    Comment:  (NOTE) SARS-CoV-2 target nucleic acids are NOT DETECTED.  The SARS-CoV-2 RNA is generally detectable in upper respiratory specimens during the acute phase of infection. The lowest concentration of SARS-CoV-2 viral copies this assay can detect is 138 copies/mL. A negative result does not preclude SARS-Cov-2 infection and should not be used as the sole basis for treatment or other patient management decisions. A negative result may occur with  improper specimen collection/handling, submission of specimen other than nasopharyngeal swab, presence of viral mutation(s) within the areas targeted by this assay, and inadequate number of viral copies(<138 copies/mL). A negative result must be combined with clinical observations, patient history, and epidemiological information. The expected result is Negative.  Fact Sheet for Patients:  05/22/20  Fact Sheet for Healthcare Providers:  BloggerCourse.com  This test is no t yet approved or cleared by the [place] FDA and  has been authorized for detection and/or diagnosis of SARS-CoV-2 by FDA under an Emergency Use Authorization (EUA). This EUA will remain  in effect (meaning this test can be used) for the duration of the COVID-19  declaration under Section 564(b)(1) of the Act, 21 U.S.C.section 360bbb-3(b)(1), unless the authorization is terminated  or revoked sooner.       Influenza A by PCR NEGATIVE NEGATIVE Final   Influenza B by PCR NEGATIVE NEGATIVE Final    Comment: (NOTE) The Xpert Xpress SARS-CoV-2/FLU/RSV plus assay is intended as an aid in the diagnosis of influenza from Nasopharyngeal swab specimens and should not be used as a sole basis for treatment. Nasal washings and aspirates are unacceptable for Xpert Xpress SARS-CoV-2/FLU/RSV testing.  Fact Sheet for Patients: BloggerCourse.com  Fact Sheet for Healthcare  Providers: SeriousBroker.it  This test is not yet approved or cleared by the [place] FDA and has been authorized for detection and/or diagnosis of SARS-CoV-2 by FDA under an Emergency Use Authorization (EUA). This EUA will remain in effect (meaning this test can be used) for the duration of the COVID-19 declaration under Section 564(b)(1) of the Act, 21 U.S.C. section 360bbb-3(b)(1), unless the authorization is terminated or revoked.  Performed at [facility], [address] [place], [place] [address]      Labs: BNP (last 3 results) No results for input(s): BNP in the last 8760 hours. Basic Metabolic Panel: Recent Labs  Lab 05/20/20 1731 05/21/20 0559 05/22/20 0413  NA 135 136 135  K 4.5 4.4 4.0  CL 102 105 101  CO2 24 25 25   GLUCOSE 82 116* 99  BUN 28* 25* 18  CREATININE 0.95 0.93 0.76  CALCIUM 9.1 8.7* 8.9   Liver Function Tests: Recent Labs  Lab 05/20/20 1731  AST 30  ALT 17  ALKPHOS 58  BILITOT 0.8  PROT 7.2  ALBUMIN 4.3   No results for input(s): LIPASE, AMYLASE in the last 168 hours. No results for input(s): AMMONIA in the last 168 hours. CBC: Recent Labs  Lab 05/20/20 1731 05/21/20 0559 05/22/20 0413  WBC 8.6 8.3 9.6  NEUTROABS 5.9  --   --   HGB 13.4 13.1 13.5  HCT 39.6 38.4* 40.8  MCV 88.0 88.5 89.9  PLT 307 269 293   Cardiac Enzymes: No results for input(s): CKTOTAL, CKMB, CKMBINDEX, TROPONINI in the last 168 hours. BNP: Invalid input(s): POCBNP CBG: No results for input(s): GLUCAP in the last 168 hours. D-Dimer No results for input(s): DDIMER in the last 72 hours. Hgb A1c No results for input(s): HGBA1C in the last 72 hours. Lipid Profile No results for input(s): CHOL, HDL, LDLCALC, TRIG, CHOLHDL, LDLDIRECT in the last 72 hours. Thyroid function studies No results for input(s): TSH, T4TOTAL, T3FREE, THYROIDAB in the last 72 hours.  Invalid input(s): FREET3 Anemia work up No results for  input(s): VITAMINB12, FOLATE, FERRITIN, TIBC, IRON, RETICCTPCT in the last 72 hours. Urinalysis No results found for: COLORURINE, APPEARANCEUR, LABSPEC, PHURINE, GLUCOSEU, HGBUR, BILIRUBINUR, KETONESUR, PROTEINUR, UROBILINOGEN, NITRITE, LEUKOCYTESUR Sepsis Labs Invalid input(s): PROCALCITONIN,  WBC,  LACTICIDVEN Microbiology Recent Results (from the past 240 hour(s))  Resp Panel by RT-PCR (Flu A&B, Covid) Nasopharyngeal Swab     Status: None   Collection Time: 05/20/20  5:31 PM   Specimen: Nasopharyngeal Swab; Nasopharyngeal(NP) swabs in vial transport medium  Result Value Ref Range Status   SARS Coronavirus 2 by RT PCR NEGATIVE NEGATIVE Final    Comment: (NOTE) SARS-CoV-2 target nucleic acids are NOT DETECTED.  The SARS-CoV-2 RNA is generally detectable in upper respiratory specimens during the acute phase of infection. The lowest concentration of SARS-CoV-2 viral copies this assay can detect is 138 copies/mL. A negative result does not preclude SARS-Cov-2  infection and should not be used as the sole basis for treatment or other patient management decisions. A negative result may occur with  improper specimen collection/handling, submission of specimen other than nasopharyngeal swab, presence of viral mutation(s) within the areas targeted by this assay, and inadequate number of viral copies(<138 copies/mL). A negative result must be combined with clinical observations, patient history, and epidemiological information. The expected result is Negative.  Fact Sheet for Patients:  BloggerCourse.comhttps://www.fda.gov/media/152166/download  Fact Sheet for Healthcare Providers:  SeriousBroker.ithttps://www.fda.gov/media/152162/download  This test is no t yet approved or cleared by the [place] FDA and  has been authorized for detection and/or diagnosis of SARS-CoV-2 by FDA under an Emergency Use Authorization (EUA). This EUA will remain  in effect (meaning this test can be used) for the duration of the COVID-19  declaration under Section 564(b)(1) of the Act, 21 U.S.C.section 360bbb-3(b)(1), unless the authorization is terminated  or revoked sooner.       Influenza A by PCR NEGATIVE NEGATIVE Final   Influenza B by PCR NEGATIVE NEGATIVE Final    Comment: (NOTE) The Xpert Xpress SARS-CoV-2/FLU/RSV plus assay is intended as an aid in the diagnosis of influenza from Nasopharyngeal swab specimens and should not be used as a sole basis for treatment. Nasal washings and aspirates are unacceptable for Xpert Xpress SARS-CoV-2/FLU/RSV testing.  Fact Sheet for Patients: BloggerCourse.comhttps://www.fda.gov/media/152166/download  Fact Sheet for Healthcare Providers: SeriousBroker.ithttps://www.fda.gov/media/152162/download  This test is not yet approved or cleared by the [place] FDA and has been authorized for detection and/or diagnosis of SARS-CoV-2 by FDA under an Emergency Use Authorization (EUA). This EUA will remain in effect (meaning this test can be used) for the duration of the COVID-19 declaration under Section 564(b)(1) of the Act, 21 U.S.C. section 360bbb-3(b)(1), unless the authorization is terminated or revoked.  Performed at [facility], [address] [place], [place] [id number]      Time coordinating discharge: Over 30 minutes  SIGNED:   Tresa MooreSudheer B Danese Dorsainvil, MD  [facility] 05/24/2020, 8:03 AM Pager   If 7PM-7AM, please contact night-coverage

## 2020-05-24 NOTE — Progress Notes (Signed)
Patient Name: Theodore Graves Date of Encounter: 05/24/2020  Hospital Problem List     Active Problems:   Hip fracture [facility])   Hypothyroidism (acquired)   Hyperlipidemia   Closed fracture of left femur (HCC)   Nausea   Bradycardia   Sinus pause   Weakness    Patient Profile      87 y.o.malewith history ofhyperlipidemia, hypertension and hypothyroidism admitted with a hip fx after a mechanical fall. Was noted to have a periprosthetic left hip fx on xray. EKG on admission nsr at 66 with first degree av block. Since admission has been noted to be in sinus bradycardia with several pauses last pm. He is asymptomatic from these.He denies syncope or presyncope at home. He denies any lightheadedness or dizziness. Review of telemetry shows sinus bradycardia with blocked PACs no prolonged pauses  Subjective   Remains asymptomatic from a cardiac standpoint  Inpatient Medications     amLODipine  5 mg Oral Daily   atorvastatin  20 mg Oral Daily   cholecalciferol  1,000 Units Oral Daily   docusate sodium  100 mg Oral BID   enoxaparin (LOVENOX) injection  40 mg Subcutaneous Q24H   feeding supplement  237 mL Oral BID BM   gabapentin  300 mg Oral TID   levothyroxine  50 mcg Oral QAC breakfast   multivitamin with minerals  1 tablet Oral Daily   senna  1 tablet Oral BID    Vital Signs    Vitals:   05/23/20 1556 05/23/20 2015 05/24/20 0404 05/24/20 0728  BP: 121/82 136/63 (!) 130/54 (!) 137/55  Pulse: 68 66 (!) 56 61  Resp: 19 18 18 15   Temp: 98.8 F (37.1 C) 99 F (37.2 C) 98.1 F (36.7 C) 98.2 F (36.8 C)  TempSrc:      SpO2: 99% 97% 97% 97%  Weight:      Height:        Intake/Output Summary (Last 24 hours) at 05/24/2020 0833 Last data filed at 05/24/2020 0730 Gross per 24 hour  Intake 240 ml  Output 700 ml  Net -460 ml   Filed Weights   05/20/20 1723  Weight: 81 kg    Physical Exam    GEN: Well nourished, well developed, in no acute distress.   HEENT: normal.  Neck: Supple, no JVD, carotid bruits, or masses. Cardiac: RRR, no murmurs, rubs, or gallops. No clubbing, cyanosis, edema.  Radials/DP/PT 2+ and equal bilaterally.  Respiratory:  Respirations regular and unlabored, clear to auscultation bilaterally. GI: Soft, nontender, nondistended, BS + x 4. MS: no deformity or atrophy. Skin: warm and dry, no rash. Neuro:  Strength and sensation are intact. Psych: Normal affect.  Labs    CBC Recent Labs    05/22/20 0413  WBC 9.6  HGB 13.5  HCT 40.8  MCV 89.9  PLT 293   Basic Metabolic Panel Recent Labs    05/24/20 0413  NA 135  K 4.0  CL 101  CO2 25  GLUCOSE 99  BUN 18  CREATININE 0.76  CALCIUM 8.9   Liver Function Tests No results for input(s): AST, ALT, ALKPHOS, BILITOT, PROT, ALBUMIN in the last 72 hours. No results for input(s): LIPASE, AMYLASE in the last 72 hours. Cardiac Enzymes No results for input(s): CKTOTAL, CKMB, CKMBINDEX, TROPONINI in the last 72 hours. BNP No results for input(s): BNP in the last 72 hours. D-Dimer No results for input(s): DDIMER in the last 72 hours. Hemoglobin A1C No results for input(s):  HGBA1C in the last 72 hours. Fasting Lipid Panel No results for input(s): CHOL, HDL, LDLCALC, TRIG, CHOLHDL, LDLDIRECT in the last 72 hours. Thyroid Function Tests No results for input(s): TSH, T4TOTAL, T3FREE, THYROIDAB in the last 72 hours.  Invalid input(s): FREET3  Telemetry    Sinus rhythm/sinus bradycardia.  No prolonged pauses.  ECG    Sinus bradycardia  Radiology    DG Chest Portable 1 View  Result Date: 05/20/2020 CLINICAL DATA:  Hip fracture, preoperative assessment, tripped and fell while pressure washing a house EXAM: PORTABLE CHEST 1 VIEW COMPARISON:  Portable exam 1855 hours without priors for comparison FINDINGS: Normal heart size, mediastinal contours, and pulmonary vascularity. Atherosclerotic calcification aorta. Minimal peribronchial thickening. No infiltrate,  pleural effusion, or pneumothorax. Bones demineralized with suspect BILATERAL chronic rotator cuff tears. IMPRESSION: No acute abnormalities. Electronically Signed   By: Ulyses SouthwardMark  Boles M.D.   On: 05/20/2020 19:27   ECHOCARDIOGRAM COMPLETE  Result Date: 05/21/2020    ECHOCARDIOGRAM REPORT   [facility] Date of Exam: 05/21/2020 Medical Rec #:  [id number]      Height:       67.0 in Accession #:    [id number]     Weight:       178.6 lb Date of Birth:  04/26/1926       BSA:          1.927 m Patient Age:    87 years       BP:           190/63 mmHg Patient Gender: M              HR:           64 bpm. Exam Location:  [facility] Procedure: 2D Echo, Cardiac Doppler and Color Doppler Indications:     R01.2 Abnormal heart sounds  History:         Patient has no prior history of Echocardiogram examinations.                  Risk Factors:Hypertension and Dyslipidemia.  Sonographer:     Sedonia SmallNaTashia Rodgers-Jones Referring Phys:  [id number] Alford HighlandICHARD WIETING Diagnosing Phys: Harold HedgeKenneth Titania Gault MD IMPRESSIONS  1. Left ventricular ejection fraction, by estimation, is 60 to 65%. The left ventricle has normal function. The left ventricle has no regional wall motion abnormalities. Left ventricular diastolic parameters are consistent with Grade I diastolic dysfunction (impaired relaxation).  2. Right ventricular systolic function is normal. The right ventricular size is mildly enlarged.  3. Left atrial size was mildly dilated.  4. The mitral valve is grossly normal. Trivial mitral valve regurgitation.  5. The aortic valve is calcified. Aortic valve regurgitation is trivial. Moderate aortic valve stenosis. FINDINGS  Left Ventricle: Left ventricular ejection fraction, by estimation, is 60 to 65%. The left ventricle has normal function. The left ventricle has no regional wall motion abnormalities. The left ventricular internal cavity size was normal in size. There is  borderline left ventricular hypertrophy. Left ventricular diastolic  parameters are consistent with Grade I diastolic dysfunction (impaired relaxation). Right Ventricle: The right ventricular size is mildly enlarged. No increase in right ventricular wall thickness. Right ventricular systolic function is normal. Left Atrium: Left atrial size was mildly dilated. Right Atrium: Right atrial size was normal in size. Pericardium: There is no evidence of pericardial effusion. Mitral Valve: The mitral valve is grossly normal. Trivial mitral valve regurgitation. Tricuspid Valve: The tricuspid valve is not well visualized. Tricuspid valve regurgitation  is mild. Aortic Valve: The aortic valve is calcified. Aortic valve regurgitation is trivial. Aortic regurgitation PHT measures 289 msec. Moderate aortic stenosis is present. Aortic valve mean gradient measures 29.4 mmHg. Aortic valve peak gradient measures 45.2 mmHg. Aortic valve area, by VTI measures 1.08 cm. Pulmonic Valve: The pulmonic valve was not well visualized. Pulmonic valve regurgitation is trivial. Aorta: The aortic root was not well visualized. IAS/Shunts: The interatrial septum was not assessed.  LEFT VENTRICLE PLAX 2D LVIDd:         3.79 cm  Diastology LVIDs:         2.62 cm  LV e' medial:    7.40 cm/s LV PW:         0.83 cm  LV E/e' medial:  12.3 LV IVS:        0.98 cm  LV e' lateral:   7.72 cm/s LVOT diam:     2.20 cm  LV E/e' lateral: 11.8 LV SV:         88 LV SV Index:   46 LVOT Area:     3.80 cm  RIGHT VENTRICLE RV Basal diam:  3.02 cm RV S prime:     12.40 cm/s TAPSE (M-mode): 2.0 cm LEFT ATRIUM             Index       RIGHT ATRIUM           Index LA diam:        4.10 cm 2.13 cm/m  RA Area:     11.80 cm LA Vol (A2C):   45.8 ml 23.77 ml/m RA Volume:   28.00 ml  14.53 ml/m LA Vol (A4C):   40.5 ml 21.02 ml/m LA Biplane Vol: 44.9 ml 23.30 ml/m  AORTIC VALVE AV Area (Vmax):    1.23 cm AV Area (Vmean):   1.16 cm AV Area (VTI):     1.08 cm AV Vmax:           336.20 cm/s AV Vmean:          260.000 cm/s AV VTI:             0.813 m AV Peak Grad:      45.2 mmHg AV Mean Grad:      29.4 mmHg LVOT Vmax:         109.00 cm/s LVOT Vmean:        79.300 cm/s LVOT VTI:          0.231 m LVOT/AV VTI ratio: 0.28 AI PHT:            289 msec  AORTA Ao Root diam: 3.40 cm Ao Asc diam:  3.30 cm MITRAL VALVE MV Area (PHT): 3.27 cm     SHUNTS MV Decel Time: 232 msec     Systemic VTI:  0.23 m MV E velocity: 91.30 cm/s   Systemic Diam: 2.20 cm MV A velocity: 131.00 cm/s MV E/A ratio:  0.70 Harold Hedge MD Electronically signed by Harold Hedge MD Signature Date/Time: 05/21/2020/8:30:58 PM    Final    DG Hip Unilat W or Wo Pelvis 2-3 Views Left  Result Date: 05/20/2020 CLINICAL DATA:  Hip fracture EXAM: DG HIP (WITH OR WITHOUT PELVIS) 2-3V LEFT COMPARISON:  09/13/2018 FINDINGS: Osseous demineralization. Multilevel degenerative disc and facet disease changes of visualized lower lumbar spine. BILATERAL hip prostheses. Displaced fracture of the proximal LEFT femur involving the greater trochanter and extending into the intertrochanteric region. A large area of lucency is seen surrounding  the proximal portion of the femoral component, with asymmetric location of the femoral head of the prosthesis within the acetabular cup suggesting liner wear, question particle disease accounting for lucency. Proximal LEFT femoral diaphysis and lesser trochanter appear intact. No dislocation identified. Visualized pelvis appears intact. Surgical clips in pelvis bilaterally. IMPRESSION: Osseous demineralization with BILATERAL hip prostheses. Fracture identified at proximal LEFT femur involving the intertrochanteric region and greater trochanter, minimally displaced. Underlying lucency at the proximal LEFT femur adjacent to the prosthesis may potentially represent sequela of particle disease as above. Electronically Signed   By: Ulyses Southward M.D.   On: 05/20/2020 19:31    Assessment & Plan     87 year old male with no apparent cardiac history being treated for  hypertension hypothyroidism and hyperlipidemia who was admitted after a mechanical fall. Noted to have a periprosthetic fracture. Not felt to be surgical candidate. Is noted to have sinus bradycardia as well as occasional dropped PACs. He is asymptomatic from this. He is not currently on any AV nodal medications. Patient denies any syncope at home. We will continue to follow on telemetry and consider Holter monitor after discharge. Based on current data, patient does not appear to be a candidate for a pacemaker.  Is stable from a cardiac standpoint.  Will sign off from a cardiac standpoint.  If any further issues arise Dr. Juliann Pares will be covering me this weekend.   Signed, Darlin Priestly Pearlene Teat MD 05/24/2020, 8:33 AM  Pager: [phone]

## 2020-05-24 NOTE — Progress Notes (Signed)
Patient A&O x4 and able to make needs known. Patient will be transported to [facility] today via EMS Transport. Discharge packet given to transportation.

## 2020-05-24 NOTE — TOC Transition Note (Addendum)
Transition of Care [facility]) - CM/SW Discharge Note   Patient Details  Name: Theodore Graves MRN: [id number] Date of Birth: September 26, 1926  Transition of [facility] CM/SW Contact:  Margarito Liner, LCSW Phone Number: 05/24/2020, 9:58 AM   Clinical Narrative:   [facility]erk[facility] Harvey approved: [id number]. Valid 3/25-3/29. Patient has orders to discharge to [facility] today. RN will call report to [phone] (Room 118). EMS transport has been arranged and he is first on the list. No further concerns. CSW signing off.  Final next level of care: Skilled Nursing Facility Barriers to Discharge: Barriers Resolved   Patient Goals and CMS Choice   CMS Medicare.gov Compare Post Acute Care list provided to:: Patient Choice offered to / list presented to : Patient,Spouse  Discharge Placement PASRR number recieved: 05/23/20            Patient chooses bed at: [facility] Patient to be transferred to facility by: EMS Name of family member notified: [name] Patient and family notified of of transfer: 05/24/20  Discharge Plan and Services     Post Acute Care Choice: Skilled Nursing Facility                               Social Determinants of Health (SDOH) Interventions     Readmission Risk Interventions No flowsheet data found.

## 2020-05-24 NOTE — Care Management Important Message (Signed)
Important Message  Patient Details  Name: Theodore Graves MRN: [id number] Date of Birth: 07/20/1926   Medicare Important Message Given:  Yes     Olegario Messier A Keigan Tafoya 05/24/2020, 9:01 AM

## 2020-05-24 NOTE — Progress Notes (Incomplete)
Patient Name: Theodore Graves Date of Encounter: 05/24/2020  Hospital Problem List     Active Problems:   Hip fracture [facility])   Hypothyroidism (acquired)   Hyperlipidemia   Closed fracture of left femur (HCC)   Nausea   Bradycardia   Sinus pause   Weakness    Patient Profile     ***  Subjective   ***  Inpatient Medications    . amLODipine  5 mg Oral Daily  . atorvastatin  20 mg Oral Daily  . cholecalciferol  1,000 Units Oral Daily  . docusate sodium  100 mg Oral BID  . enoxaparin (LOVENOX) injection  40 mg Subcutaneous Q24H  . feeding supplement  237 mL Oral BID BM  . gabapentin  300 mg Oral TID  . levothyroxine  50 mcg Oral QAC breakfast  . multivitamin with minerals  1 tablet Oral Daily  . senna  1 tablet Oral BID    Vital Signs    Vitals:   05/23/20 1556 05/23/20 2015 05/24/20 0404 05/24/20 0728  BP: 121/82 136/63 (!) 130/54 (!) 137/55  Pulse: 68 66 (!) 56 61  Resp: 19 18 18 15   Temp: 98.8 F (37.1 C) 99 F (37.2 C) 98.1 F (36.7 C) 98.2 F (36.8 C)  TempSrc:      SpO2: 99% 97% 97% 97%  Weight:      Height:        Intake/Output Summary (Last 24 hours) at 05/24/2020 0735 Last data filed at 05/23/2020 2126 Gross per 24 hour  Intake 240 ml  Output 450 ml  Net -210 ml   Filed Weights   05/20/20 1723  Weight: 81 kg    Physical Exam    GEN: Well nourished, well developed, in no acute distress.  HEENT: normal.  Neck: Supple, no JVD, carotid bruits, or masses. Cardiac: RRR, no murmurs, rubs, or gallops. No clubbing, cyanosis, edema.  Radials/DP/PT 2+ and equal bilaterally.  Respiratory:  Respirations regular and unlabored, clear to auscultation bilaterally. GI: Soft, nontender, nondistended, BS + x 4. MS: no deformity or atrophy. Skin: warm and dry, no rash. Neuro:  Strength and sensation are intact. Psych: Normal affect.  Labs    CBC Recent Labs    05/22/20 0413  WBC 9.6  HGB 13.5  HCT 40.8  MCV 89.9  PLT 293   Basic Metabolic  Panel Recent Labs    05/22/20 0413  NA 135  K 4.0  CL 101  CO2 25  GLUCOSE 99  BUN 18  CREATININE 0.76  CALCIUM 8.9   Liver Function Tests No results for input(s): AST, ALT, ALKPHOS, BILITOT, PROT, ALBUMIN in the last 72 hours. No results for input(s): LIPASE, AMYLASE in the last 72 hours. Cardiac Enzymes No results for input(s): CKTOTAL, CKMB, CKMBINDEX, TROPONINI in the last 72 hours. BNP No results for input(s): BNP in the last 72 hours. D-Dimer No results for input(s): DDIMER in the last 72 hours. Hemoglobin A1C No results for input(s): HGBA1C in the last 72 hours. Fasting Lipid Panel No results for input(s): CHOL, HDL, LDLCALC, TRIG, CHOLHDL, LDLDIRECT in the last 72 hours. Thyroid Function Tests No results for input(s): TSH, T4TOTAL, T3FREE, THYROIDAB in the last 72 hours.  Invalid input(s): FREET3  Telemetry    ***  ECG    ***  Radiology    DG Chest Portable 1 View  Result Date: 05/20/2020 CLINICAL DATA:  Hip fracture, preoperative assessment, tripped and fell while pressure washing a house EXAM: PORTABLE  CHEST 1 VIEW COMPARISON:  Portable exam 1855 hours without priors for comparison FINDINGS: Normal heart size, mediastinal contours, and pulmonary vascularity. Atherosclerotic calcification aorta. Minimal peribronchial thickening. No infiltrate, pleural effusion, or pneumothorax. Bones demineralized with suspect BILATERAL chronic rotator cuff tears. IMPRESSION: No acute abnormalities. Electronically Signed   By: Ulyses Southward M.D.   On: 05/20/2020 19:27   ECHOCARDIOGRAM COMPLETE  Result Date: 05/21/2020    ECHOCARDIOGRAM REPORT   Patient Name:   Theodore Graves [facility] Date of Exam: 05/21/2020 Medical Rec #:  [id number]      Height:       67.0 in Accession #:    [id number]     Weight:       178.6 lb Date of Birth:  Apr 06, 1926       BSA:          1.927 m Patient Age:    87 years       BP:           190/63 mmHg Patient Gender: M              HR:           64 bpm. Exam Location:   [facility] Procedure: 2D Echo, Cardiac Doppler and Color Doppler Indications:     R01.2 Abnormal heart sounds  History:         Patient has no prior history of Echocardiogram examinations.                  Risk Factors:Hypertension and Dyslipidemia.  Sonographer:     Sedonia Small Rodgers-Jones Referring Phys:  [id number] Alford Highland Diagnosing Phys: Harold Hedge MD IMPRESSIONS  1. Left ventricular ejection fraction, by estimation, is 60 to 65%. The left ventricle has normal function. The left ventricle has no regional wall motion abnormalities. Left ventricular diastolic parameters are consistent with Grade I diastolic dysfunction (impaired relaxation).  2. Right ventricular systolic function is normal. The right ventricular size is mildly enlarged.  3. Left atrial size was mildly dilated.  4. The mitral valve is grossly normal. Trivial mitral valve regurgitation.  5. The aortic valve is calcified. Aortic valve regurgitation is trivial. Moderate aortic valve stenosis. FINDINGS  Left Ventricle: Left ventricular ejection fraction, by estimation, is 60 to 65%. The left ventricle has normal function. The left ventricle has no regional wall motion abnormalities. The left ventricular internal cavity size was normal in size. There is  borderline left ventricular hypertrophy. Left ventricular diastolic parameters are consistent with Grade I diastolic dysfunction (impaired relaxation). Right Ventricle: The right ventricular size is mildly enlarged. No increase in right ventricular wall thickness. Right ventricular systolic function is normal. Left Atrium: Left atrial size was mildly dilated. Right Atrium: Right atrial size was normal in size. Pericardium: There is no evidence of pericardial effusion. Mitral Valve: The mitral valve is grossly normal. Trivial mitral valve regurgitation. Tricuspid Valve: The tricuspid valve is not well visualized. Tricuspid valve regurgitation is mild. Aortic Valve: The aortic valve is calcified. Aortic  valve regurgitation is trivial. Aortic regurgitation PHT measures 289 msec. Moderate aortic stenosis is present. Aortic valve mean gradient measures 29.4 mmHg. Aortic valve peak gradient measures 45.2 mmHg. Aortic valve area, by VTI measures 1.08 cm. Pulmonic Valve: The pulmonic valve was not well visualized. Pulmonic valve regurgitation is trivial. Aorta: The aortic root was not well visualized. IAS/Shunts: The interatrial septum was not assessed.  LEFT VENTRICLE PLAX 2D LVIDd:         3.79 cm  Diastology  LVIDs:         2.62 cm  LV e' medial:    7.40 cm/s LV PW:         0.83 cm  LV E/e' medial:  12.3 LV IVS:        0.98 cm  LV e' lateral:   7.72 cm/s LVOT diam:     2.20 cm  LV E/e' lateral: 11.8 LV SV:         88 LV SV Index:   46 LVOT Area:     3.80 cm  RIGHT VENTRICLE RV Basal diam:  3.02 cm RV S prime:     12.40 cm/s TAPSE (M-mode): 2.0 cm LEFT ATRIUM             Index       RIGHT ATRIUM           Index LA diam:        4.10 cm 2.13 cm/m  RA Area:     11.80 cm LA Vol (A2C):   45.8 ml 23.77 ml/m RA Volume:   28.00 ml  14.53 ml/m LA Vol (A4C):   40.5 ml 21.02 ml/m LA Biplane Vol: 44.9 ml 23.30 ml/m  AORTIC VALVE AV Area (Vmax):    1.23 cm AV Area (Vmean):   1.16 cm AV Area (VTI):     1.08 cm AV Vmax:           336.20 cm/s AV Vmean:          260.000 cm/s AV VTI:            0.813 m AV Peak Grad:      45.2 mmHg AV Mean Grad:      29.4 mmHg LVOT Vmax:         109.00 cm/s LVOT Vmean:        79.300 cm/s LVOT VTI:          0.231 m LVOT/AV VTI ratio: 0.28 AI PHT:            289 msec  AORTA Ao Root diam: 3.40 cm Ao Asc diam:  3.30 cm MITRAL VALVE MV Area (PHT): 3.27 cm     SHUNTS MV Decel Time: 232 msec     Systemic VTI:  0.23 m MV E velocity: 91.30 cm/s   Systemic Diam: 2.20 cm MV A velocity: 131.00 cm/s MV E/A ratio:  0.70 Harold Hedge MD Electronically signed by Harold Hedge MD Signature Date/Time: 05/21/2020/8:30:58 PM    Final    DG Hip Unilat W or Wo Pelvis 2-3 Views Left  Result Date:  05/20/2020 CLINICAL DATA:  Hip fracture EXAM: DG HIP (WITH OR WITHOUT PELVIS) 2-3V LEFT COMPARISON:  09/13/2018 FINDINGS: Osseous demineralization. Multilevel degenerative disc and facet disease changes of visualized lower lumbar spine. BILATERAL hip prostheses. Displaced fracture of the proximal LEFT femur involving the greater trochanter and extending into the intertrochanteric region. A large area of lucency is seen surrounding the proximal portion of the femoral component, with asymmetric location of the femoral head of the prosthesis within the acetabular cup suggesting liner wear, question particle disease accounting for lucency. Proximal LEFT femoral diaphysis and lesser trochanter appear intact. No dislocation identified. Visualized pelvis appears intact. Surgical clips in pelvis bilaterally. IMPRESSION: Osseous demineralization with BILATERAL hip prostheses. Fracture identified at proximal LEFT femur involving the intertrochanteric region and greater trochanter, minimally displaced. Underlying lucency at the proximal LEFT femur adjacent to the prosthesis may potentially represent sequela of particle disease as above. Electronically  Signed   By: Ulyses SouthwardMark  Boles M.D.   On: 05/20/2020 19:31    Assessment & Plan    ***  Signed, Darlin PriestlyKenneth A. Fath MD 05/24/2020, 7:35 AM  Pager: [phone]

## 2020-05-24 NOTE — Progress Notes (Signed)
Report called to [phone], Carita Pian RN. All questions answered.

## 2020-05-27 DIAGNOSIS — E039 Hypothyroidism, unspecified: Secondary | ICD-10-CM

## 2020-05-27 DIAGNOSIS — I10 Essential (primary) hypertension: Secondary | ICD-10-CM | POA: Diagnosis not present

## 2020-05-27 DIAGNOSIS — M9702XS Periprosthetic fracture around internal prosthetic left hip joint, sequela: Secondary | ICD-10-CM | POA: Diagnosis not present

## 2020-05-27 DIAGNOSIS — G609 Hereditary and idiopathic neuropathy, unspecified: Secondary | ICD-10-CM | POA: Diagnosis not present

## 2020-05-27 DIAGNOSIS — I495 Sick sinus syndrome: Secondary | ICD-10-CM | POA: Diagnosis not present

## 2022-03-15 IMAGING — CR DG HIP (WITH OR WITHOUT PELVIS) 2-3V*L*
1 series · 3 of 3 positions shown · non-contrast
Comparison: 09/13/2018

CLINICAL DATA: Hip fracture

EXAM:
DG HIP (WITH OR WITHOUT PELVIS) 2-3V LEFT

[Series 1: dg hip unilat w or w/o pelvis 2-3 views  · non-contrast · 0.14mm/px · 3 of 3 slices shown]
[im 1/3]
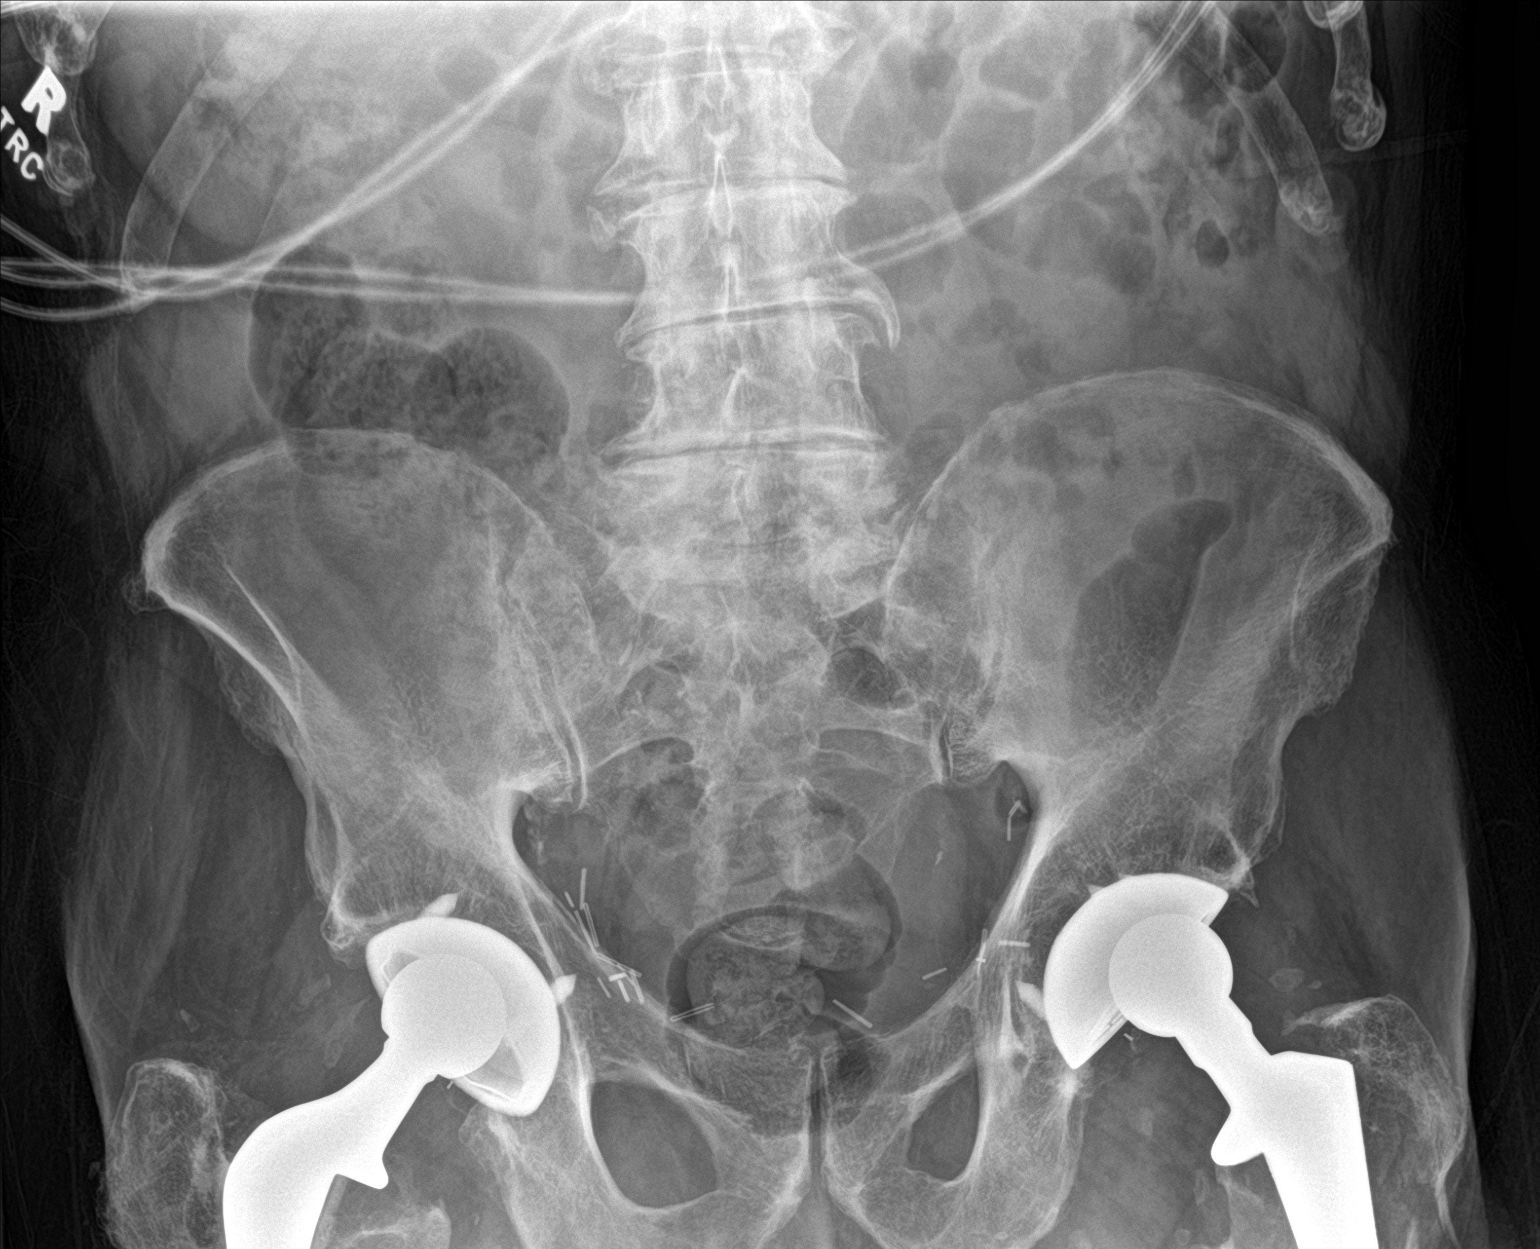
[im 2/3]
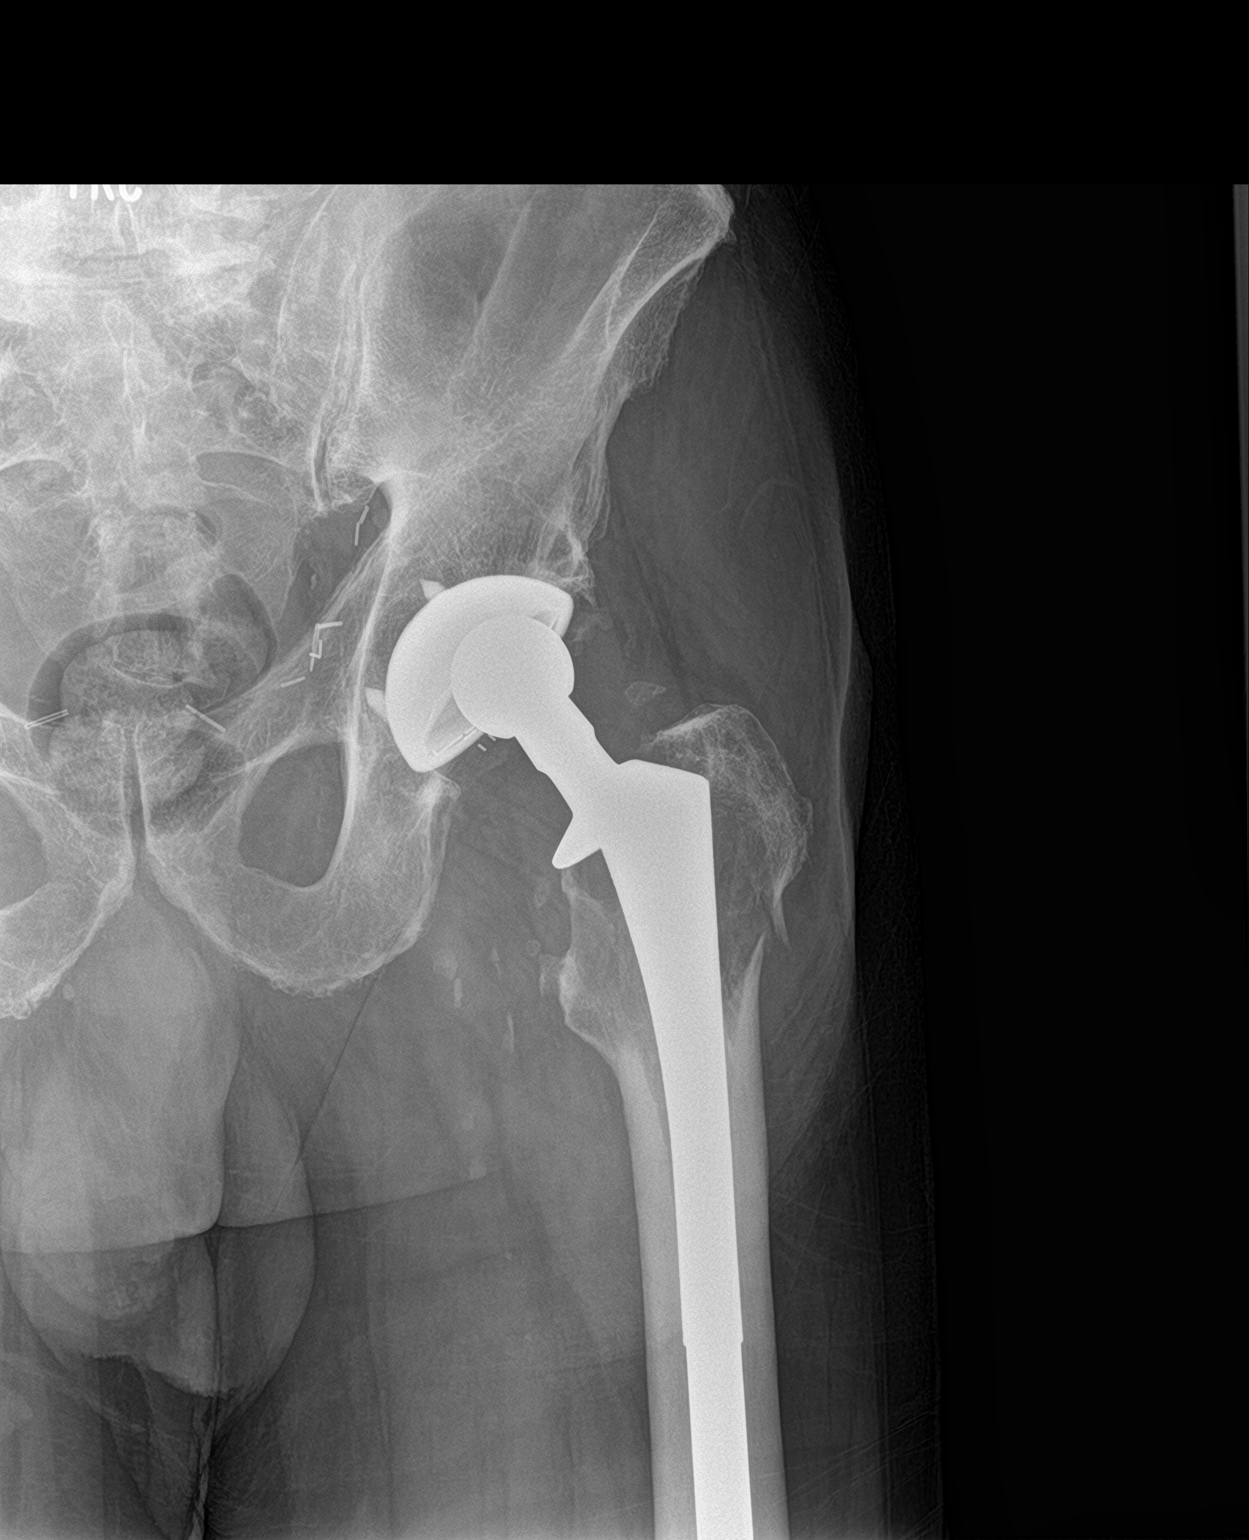
[im 3/3]
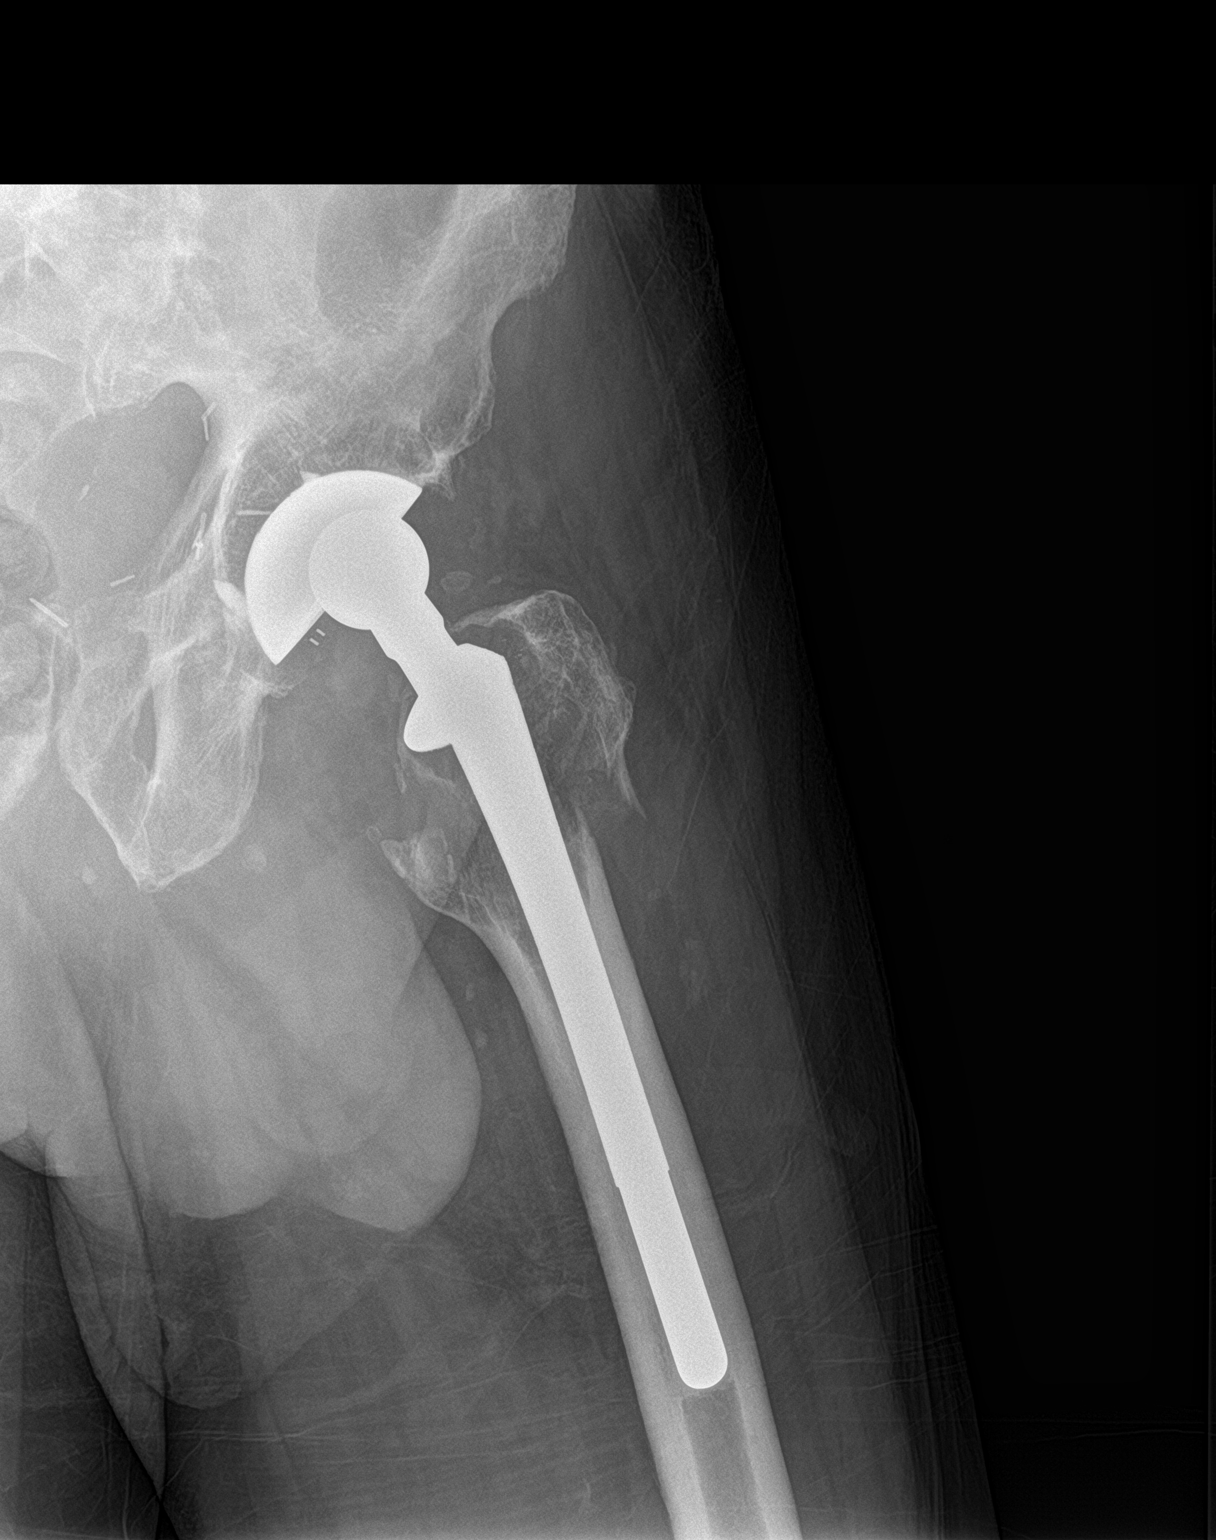

[3 of 3 positions shown; findings below may reference images not displayed]

FINDINGS: Osseous demineralization.

Multilevel degenerative disc and facet disease changes of visualized
lower lumbar spine.

BILATERAL hip prostheses.

Displaced fracture of the proximal LEFT femur involving the greater
trochanter and extending into the intertrochanteric region.

A large area of lucency is seen surrounding the proximal portion of
the femoral component, with asymmetric location of the femoral head
of the prosthesis within the acetabular cup suggesting liner wear,
question particle disease accounting for lucency.

Proximal LEFT femoral diaphysis and lesser trochanter appear intact.

No dislocation identified.

Visualized pelvis appears intact.

Surgical clips in pelvis bilaterally.
IMPRESSION: Osseous demineralization with BILATERAL hip prostheses.

Fracture identified at proximal LEFT femur involving the
intertrochanteric region and greater trochanter, minimally
displaced.

Underlying lucency at the proximal LEFT femur adjacent to the
prosthesis may potentially represent sequela of particle disease as
above.

## 2022-04-06 ENCOUNTER — Emergency Department
Admission: EM | Admit: 2022-04-06 | Discharge: 2022-04-06 | Disposition: A | Discharge status: Home / Self Care | Payer: Medicare Other | Attending: Emergency Medicine | Admitting: Emergency Medicine

## 2022-04-06 ENCOUNTER — Emergency Department: Payer: Medicare Other

## 2022-04-06 DIAGNOSIS — I10 Essential (primary) hypertension: Secondary | ICD-10-CM | POA: Diagnosis not present

## 2022-04-06 DIAGNOSIS — R0602 Shortness of breath: Secondary | ICD-10-CM | POA: Diagnosis present

## 2022-04-06 DIAGNOSIS — G8929 Other chronic pain: Secondary | ICD-10-CM

## 2022-04-06 DIAGNOSIS — M542 Cervicalgia: Secondary | ICD-10-CM | POA: Diagnosis not present

## 2022-04-06 DIAGNOSIS — Z1152 Encounter for screening for COVID-19: Secondary | ICD-10-CM | POA: Insufficient documentation

## 2022-04-06 LAB — RESP PANEL BY RT-PCR (RSV, FLU A&B, COVID)  RVPGX2
Influenza A by PCR: NEGATIVE
Influenza B by PCR: NEGATIVE
Resp Syncytial Virus by PCR: NEGATIVE
SARS Coronavirus 2 by RT PCR: NEGATIVE

## 2022-04-06 LAB — COMPREHENSIVE METABOLIC PANEL
ALT: 19 U/L (ref 0–44)
AST: 34 U/L (ref 15–41)
Albumin: 4 g/dL (ref 3.5–5.0)
Alkaline Phosphatase: 61 U/L (ref 38–126)
Anion gap: 11 (ref 5–15)
BUN: 23 mg/dL (ref 8–23)
CO2: 25 mmol/L (ref 22–32)
Calcium: 9.7 mg/dL (ref 8.9–10.3)
Chloride: 100 mmol/L (ref 98–111)
Creatinine, Ser: 0.93 mg/dL (ref 0.61–1.24)
GFR, Estimated: >60 mL/min (ref >60)
Glucose, Bld: 91 mg/dL (ref 70–99)
Potassium: 3.8 mmol/L (ref 3.5–5.1)
Sodium: 136 mmol/L (ref 135–145)
Total Bilirubin: 0.9 mg/dL (ref 0.3–1.2)
Total Protein: 7.4 g/dL (ref 6.5–8.1)

## 2022-04-06 LAB — CBC
HCT: 43.2 % (ref 39.0–52.0)
Hemoglobin: 14.2 g/dL (ref 13.0–17.0)
MCH: 29.3 pg (ref 26.0–34.0)
MCHC: 32.9 g/dL (ref 30.0–36.0)
MCV: 89.3 fL (ref 80.0–100.0)
Platelets: 380 10*3/uL (ref 150–400)
RBC: 4.84 MIL/uL (ref 4.22–5.81)
RDW: 13.2 % (ref 11.5–15.5)
WBC: 9.9 10*3/uL (ref 4.0–10.5)
nRBC: 0 % (ref 0.0–0.2)

## 2022-04-06 LAB — TROPONIN I (HIGH SENSITIVITY): Troponin I (High Sensitivity): 13 ng/L (ref <18)

## 2022-04-06 MED ORDER — LIDOCAINE 5 % EX PTCH: 1.0000 | MEDICATED_PATCH | Freq: Two times a day (BID) | CUTANEOUS | 0 refills | Status: AC

## 2022-04-06 MED ORDER — LIDOCAINE 5 % EX PTCH
1.0000 | MEDICATED_PATCH | CUTANEOUS | Status: DC
  Administered 2022-04-06: 1 via TRANSDERMAL
  Filled 2022-04-06: qty 1

## 2022-04-06 MED ORDER — CYCLOBENZAPRINE HCL 5 MG PO TABS: 5.0000 mg | ORAL_TABLET | Freq: Three times a day (TID) | ORAL | 0 refills | Status: AC | PRN

## 2022-04-06 NOTE — ED Triage Notes (Signed)
First Nurse Note:  Arrives from home via [facility].  C/O SOB x 1 day.  Per EMS.  Lungs clear and equal.  99% on RA.  VS wnl.  History of Anxiety, high cholesterol, HTN

## 2022-04-06 NOTE — ED Triage Notes (Signed)
Pt BIBA from home. Pt states he was watching TV and became suddenly short of breath. Wife states pt has had a lot of mucous.

## 2022-04-06 NOTE — ED Notes (Signed)
Pt discharge to home. Pt VSS, GCS 15, NAD. Pt verbalized understanding of discharge instructions with no additional questions at this time.  

## 2022-04-06 NOTE — ED Provider Notes (Signed)
[facility] Provider Note    Event Date/Time   First MD Initiated Contact with Patient 04/06/22 1246     (approximate)   History   Chief Complaint Shortness of Breath   HPI  Theodore Graves is a 89 y.o. male with past medical history of hypertension, hyperlipidemia, and anxiety who presents to the ED complaining of shortness of breath.  Patient reports that he has been dealing with intermittent difficulty breathing since waking up this morning, states it seems to come and go with no exacerbating or alleviating factors.  He has not had any fever or cough and denies any pain in his chest.  He has not noticed any pain or swelling in his legs.  He is not aware of any sick contacts and denies any nausea, vomiting, or diarrhea.  He does additionally complain of pain extending throughout his neck, has been dealing with similar pain for multiple months and denies any recent trauma.  He denies any numbness or weakness in his extremities, pain does not seem to radiate from his neck.  He has been following with Dr. Sharlet Salina of [facility] for this problem, previously prescribed pain medication.     Physical Exam   Triage Vital Signs: ED Triage Vitals [04/06/22 1131]  Enc Vitals Group     BP 138/66     Pulse Rate 63     Resp 20     Temp 97.7 F (36.5 C)     Temp Source Oral     SpO2 100 %     Weight 180 lb (81.6 kg)     Height 5\' 7"  (1.702 m)     Head Circumference      Peak Flow      Pain Score 2     Pain Loc      Pain Edu?      Excl. in [place]?     Most recent vital signs: Vitals:   04/06/22 1430 04/06/22 1500  BP: (!) 152/53 (!) 137/47  Pulse: 63 73  Resp:  18  Temp:  97.9 F (36.6 C)  SpO2: 100% 99%    Constitutional: Alert and oriented. Eyes: Conjunctivae are normal. Head: Atraumatic. Nose: No congestion/rhinnorhea. Mouth/Throat: Mucous membranes are moist.  Neck: No midline cervical spine tenderness to palpation. Cardiovascular: Normal rate, regular  rhythm. Grossly normal heart sounds.  2+ radial pulses bilaterally. Respiratory: Normal respiratory effort.  No retractions. Lungs CTAB. Gastrointestinal: Soft and nontender. No distention. Musculoskeletal: No lower extremity tenderness nor edema.  Neurologic:  Normal speech and language. No gross focal neurologic deficits are appreciated.    ED Results / Procedures / Treatments   Labs (all labs ordered are listed, but only abnormal results are displayed) Labs Reviewed  RESP PANEL BY RT-PCR (RSV, FLU A&B, COVID)  [id number]  COMPREHENSIVE METABOLIC PANEL  CBC  TROPONIN I (HIGH SENSITIVITY)     EKG  ED ECG REPORT I, Blake Divine, the attending physician, personally viewed and interpreted this ECG.   Date: 04/06/2022  EKG Time: 11:37  Rate: 59  Rhythm: sinus bradycardia  Axis: Normal  Intervals:left anterior fascicular block  ST&T Change: None  RADIOLOGY Chest x-ray reviewed and interpreted by me with no infiltrate, edema, or effusion.  PROCEDURES:  Critical Care performed: No  Procedures   MEDICATIONS ORDERED IN ED: Medications  lidocaine (LIDODERM) 5 % 1 patch (1 patch Transdermal Patch Applied 04/06/22 1330)     IMPRESSION / MDM / ASSESSMENT AND PLAN / ED COURSE  I reviewed the triage vital signs and the nursing notes.                              89 y.o. male with past medical history of hypertension, hyperlipidemia, and anxiety who presents to the ED complaining of intermittent difficulty breathing since waking up this morning along with chronic pain in the middle of his neck.  Patient's presentation is most consistent with acute presentation with potential threat to life or bodily function.  Differential diagnosis includes, but is not limited to, ACS, PE, pneumonia, pneumothorax, CHF, COPD, influenza, COVID-19.  Patient nontoxic-appearing and in no acute distress, vital signs are unremarkable.  He is not in any respiratory distress and maintaining oxygen  saturations at 99% on room air, lungs clear to auscultation bilaterally.  EKG shows no evidence of arrhythmia or ischemia and troponin is negative, low suspicion for ACS.  He denies any shortness of breath at the time of my evaluation and has not had any chest pain, doubt PE.  Chest x-ray is unremarkable and remainder of labs are reassuring with no significant anemia, leukocytosis, electrolyte abnormality, or AKI.  Testing for COVID and flu is negative.  Patient complains of chronic pain in his neck but has not had any recent trauma to necessitate imaging.  He has no weakness in his extremities and I doubt central cord process, suspect muscle strain or radiculopathy.  He is appropriate for outpatient management, will be prescribed Lidoderm patches and small amount of muscle relaxants.  He was counseled to take muscle relaxant at night to help him sleep and do not take with any narcotic pain medication.  He was counseled to follow-up with his PM&R provider and to return to the ED for new or worsening symptoms, patient and spouse agree with plan.      FINAL CLINICAL IMPRESSION(S) / ED DIAGNOSES   Final diagnoses:  Shortness of breath  Chronic neck pain     Rx / DC Orders   ED Discharge Orders          Ordered    lidocaine (LIDODERM) 5 %  Every 12 hours        04/06/22 1505    cyclobenzaprine (FLEXERIL) 5 MG tablet  3 times daily PRN        04/06/22 1505             Note:  This document was prepared using Dragon voice recognition software and may include unintentional dictation errors.   Blake Divine, MD 04/06/22 [phone]

## 2022-04-17 ENCOUNTER — Other Ambulatory Visit: Payer: Self-pay | Admitting: Physical Medicine and Rehabilitation

## 2022-04-17 DIAGNOSIS — M4802 Spinal stenosis, cervical region: Secondary | ICD-10-CM

## 2022-04-17 DIAGNOSIS — M5412 Radiculopathy, cervical region: Secondary | ICD-10-CM

## 2022-05-01 ENCOUNTER — Ambulatory Visit
Admission: RE | Admit: 2022-05-01 | Discharge: 2022-05-01 | Disposition: A | Discharge status: Home / Self Care | Payer: Medicare Other | Source: Ambulatory Visit | Attending: Physical Medicine and Rehabilitation | Admitting: Physical Medicine and Rehabilitation

## 2022-05-01 DIAGNOSIS — M4802 Spinal stenosis, cervical region: Secondary | ICD-10-CM

## 2022-05-01 DIAGNOSIS — M5412 Radiculopathy, cervical region: Secondary | ICD-10-CM
